# Patient Record
Sex: Female | Born: 1955
Health system: Southern US, Community
[De-identification: ages and names within clinical notes are randomized; demographics above are authoritative.]

## PROBLEM LIST (undated history)

## (undated) DIAGNOSIS — F17200 Nicotine dependence, unspecified, uncomplicated: Secondary | ICD-10-CM

## (undated) DIAGNOSIS — L570 Actinic keratosis: Secondary | ICD-10-CM

## (undated) HISTORY — DX: Actinic keratosis: L57.0

## (undated) HISTORY — DX: Nicotine dependence, unspecified, uncomplicated: F17.200

## (undated) HISTORY — PX: TUBAL LIGATION: SHX77

---

## 2002-01-31 ENCOUNTER — Encounter: Payer: Self-pay | Admitting: Obstetrics and Gynecology

## 2002-01-31 ENCOUNTER — Ambulatory Visit (HOSPITAL_COMMUNITY): Admission: RE | Admit: 2002-01-31 | Discharge: 2002-01-31 | Payer: Self-pay | Admitting: Obstetrics and Gynecology

## 2003-02-13 ENCOUNTER — Ambulatory Visit (HOSPITAL_COMMUNITY): Admission: RE | Admit: 2003-02-13 | Discharge: 2003-02-13 | Payer: Self-pay | Admitting: Obstetrics and Gynecology

## 2003-02-13 ENCOUNTER — Encounter: Payer: Self-pay | Admitting: Obstetrics and Gynecology

## 2004-02-15 ENCOUNTER — Ambulatory Visit (HOSPITAL_COMMUNITY): Admission: RE | Admit: 2004-02-15 | Discharge: 2004-02-15 | Payer: Self-pay | Admitting: Obstetrics and Gynecology

## 2005-02-18 ENCOUNTER — Ambulatory Visit (HOSPITAL_COMMUNITY): Admission: RE | Admit: 2005-02-18 | Discharge: 2005-02-18 | Payer: Self-pay | Admitting: Obstetrics and Gynecology

## 2006-03-12 ENCOUNTER — Ambulatory Visit (HOSPITAL_COMMUNITY): Admission: RE | Admit: 2006-03-12 | Discharge: 2006-03-12 | Payer: Self-pay | Admitting: Obstetrics and Gynecology

## 2007-03-16 ENCOUNTER — Ambulatory Visit (HOSPITAL_COMMUNITY): Admission: RE | Admit: 2007-03-16 | Discharge: 2007-03-16 | Payer: Self-pay | Admitting: Obstetrics and Gynecology

## 2008-03-20 ENCOUNTER — Ambulatory Visit (HOSPITAL_COMMUNITY): Admission: RE | Admit: 2008-03-20 | Discharge: 2008-03-20 | Payer: Self-pay | Admitting: Obstetrics and Gynecology

## 2008-09-17 ENCOUNTER — Other Ambulatory Visit: Admission: RE | Admit: 2008-09-17 | Discharge: 2008-09-17 | Payer: Self-pay | Admitting: Obstetrics & Gynecology

## 2009-03-25 ENCOUNTER — Ambulatory Visit (HOSPITAL_COMMUNITY): Admission: RE | Admit: 2009-03-25 | Discharge: 2009-03-25 | Payer: Self-pay | Admitting: Obstetrics & Gynecology

## 2009-11-21 ENCOUNTER — Other Ambulatory Visit: Admission: RE | Admit: 2009-11-21 | Discharge: 2009-11-21 | Payer: Self-pay | Admitting: Obstetrics and Gynecology

## 2010-04-25 ENCOUNTER — Ambulatory Visit (HOSPITAL_COMMUNITY)
Admission: RE | Admit: 2010-04-25 | Discharge: 2010-04-25 | Payer: Self-pay | Source: Home / Self Care | Attending: Obstetrics & Gynecology | Admitting: Obstetrics & Gynecology

## 2010-11-26 ENCOUNTER — Other Ambulatory Visit (HOSPITAL_COMMUNITY)
Admission: RE | Admit: 2010-11-26 | Discharge: 2010-11-26 | Disposition: A | Discharge status: Home / Self Care | Payer: 59 | Source: Ambulatory Visit | Attending: Obstetrics and Gynecology | Admitting: Obstetrics and Gynecology

## 2010-11-26 DIAGNOSIS — Z01419 Encounter for gynecological examination (general) (routine) without abnormal findings: Secondary | ICD-10-CM | POA: Insufficient documentation

## 2011-03-27 ENCOUNTER — Other Ambulatory Visit: Payer: Self-pay | Admitting: Obstetrics & Gynecology

## 2011-03-27 DIAGNOSIS — Z139 Encounter for screening, unspecified: Secondary | ICD-10-CM

## 2011-04-28 ENCOUNTER — Ambulatory Visit (HOSPITAL_COMMUNITY)
Admission: RE | Admit: 2011-04-28 | Discharge: 2011-04-28 | Disposition: A | Discharge status: Home / Self Care | Payer: 59 | Source: Ambulatory Visit | Attending: Obstetrics & Gynecology | Admitting: Obstetrics & Gynecology

## 2011-04-28 DIAGNOSIS — Z1231 Encounter for screening mammogram for malignant neoplasm of breast: Secondary | ICD-10-CM | POA: Insufficient documentation

## 2011-04-28 DIAGNOSIS — Z139 Encounter for screening, unspecified: Secondary | ICD-10-CM

## 2011-05-11 ENCOUNTER — Other Ambulatory Visit: Payer: Self-pay | Admitting: Obstetrics & Gynecology

## 2011-05-11 DIAGNOSIS — R928 Other abnormal and inconclusive findings on diagnostic imaging of breast: Secondary | ICD-10-CM

## 2011-06-03 ENCOUNTER — Ambulatory Visit (HOSPITAL_COMMUNITY)
Admission: RE | Admit: 2011-06-03 | Discharge: 2011-06-03 | Disposition: A | Discharge status: Home / Self Care | Payer: 59 | Source: Ambulatory Visit | Attending: Obstetrics & Gynecology | Admitting: Obstetrics & Gynecology

## 2011-06-03 DIAGNOSIS — R928 Other abnormal and inconclusive findings on diagnostic imaging of breast: Secondary | ICD-10-CM

## 2011-11-30 ENCOUNTER — Other Ambulatory Visit (HOSPITAL_COMMUNITY)
Admission: RE | Admit: 2011-11-30 | Discharge: 2011-11-30 | Disposition: A | Discharge status: Home / Self Care | Payer: 59 | Source: Ambulatory Visit | Attending: Obstetrics and Gynecology | Admitting: Obstetrics and Gynecology

## 2011-11-30 DIAGNOSIS — Z1151 Encounter for screening for human papillomavirus (HPV): Secondary | ICD-10-CM | POA: Insufficient documentation

## 2011-11-30 DIAGNOSIS — Z01419 Encounter for gynecological examination (general) (routine) without abnormal findings: Secondary | ICD-10-CM | POA: Insufficient documentation

## 2012-05-10 ENCOUNTER — Other Ambulatory Visit: Payer: Self-pay | Admitting: Adult Health

## 2012-05-10 DIAGNOSIS — Z139 Encounter for screening, unspecified: Secondary | ICD-10-CM

## 2012-06-06 ENCOUNTER — Ambulatory Visit (HOSPITAL_COMMUNITY)
Admission: RE | Admit: 2012-06-06 | Discharge: 2012-06-06 | Disposition: A | Discharge status: Home / Self Care | Payer: 59 | Source: Ambulatory Visit | Attending: Adult Health | Admitting: Adult Health

## 2012-06-06 DIAGNOSIS — Z139 Encounter for screening, unspecified: Secondary | ICD-10-CM

## 2012-06-06 DIAGNOSIS — Z1231 Encounter for screening mammogram for malignant neoplasm of breast: Secondary | ICD-10-CM | POA: Insufficient documentation

## 2012-12-02 ENCOUNTER — Ambulatory Visit (INDEPENDENT_AMBULATORY_CARE_PROVIDER_SITE_OTHER): Payer: 59 | Admitting: Adult Health

## 2012-12-02 ENCOUNTER — Encounter: Payer: Self-pay | Admitting: Adult Health

## 2012-12-02 VITALS — BP 158/80 | Ht 63.0 in | Wt 154.5 lb

## 2012-12-02 DIAGNOSIS — L570 Actinic keratosis: Secondary | ICD-10-CM

## 2012-12-02 DIAGNOSIS — Z01419 Encounter for gynecological examination (general) (routine) without abnormal findings: Secondary | ICD-10-CM

## 2012-12-02 HISTORY — DX: Actinic keratosis: L57.0

## 2012-12-02 NOTE — Progress Notes (Signed)
Patient ID: Nichole Gay, female   DOB: 1955-05-18, 57 y.o.   MRN: [id number] History of Present Illness: Nichole Gay is a 57 year old white female, married in for physical. She had a normal pap with negative HPV 7/13. And normal mammogram 1/14.No complaints.   Current Medications, Allergies, Past Medical History, Past Surgical History, Family History and Social History were reviewed in [employer] record.     Review of Systems: Patient denies any headaches, blurred vision, shortness of breath, chest pain, abdominal pain, problems with bowel movements, urination, or intercourse. No joint pains or mood changes.    Physical Exam:BP 158/80  Ht 5\' 3"  (1.6 m)  Wt 154 lb 8 oz (70.081 kg)  BMI 27.38 kg/m2 General:  Well developed, well nourished, no acute distress Skin:  Warm and dry, she has a AK on back that itches Neck:  Midline trachea, normal thyroid Lungs; Clear to auscultation bilaterally Breast:  No dominant palpable mass, retraction, or nipple discharge Cardiovascular: Regular rate and rhythm Abdomen:  Soft, non tender, no hepatosplenomegaly Pelvic:  External genitalia is normal in appearance.  The vagina is normal in appearance. The cervix is bulbous.  Uterus is felt to be normal size, shape, and contour.  No adnexal masses or tenderness noted. Rectal: Good sphincter tone, no polyps, or hemorrhoids felt.  Hemoccult negative. Extremities:  No swelling or varicosities noted Psych:  Alert and cooperative, seems happy   Impression: Yearly gyn exam-no pap AK    Plan: Physical in 1 year  Mammogram yearly Colonoscopy advised See Dr Reuel Boom for AK removal Order given for CBC,CMP,TSH,lipids

## 2012-12-02 NOTE — Patient Instructions (Addendum)
Physical in 1 year Mammogram yearly  labs in future Colonoscopy needed See Dr Reuel Boom for AK

## 2013-05-31 ENCOUNTER — Other Ambulatory Visit: Payer: Self-pay | Admitting: Adult Health

## 2013-05-31 DIAGNOSIS — Z139 Encounter for screening, unspecified: Secondary | ICD-10-CM

## 2013-06-19 ENCOUNTER — Ambulatory Visit (HOSPITAL_COMMUNITY)
Admission: RE | Admit: 2013-06-19 | Discharge: 2013-06-19 | Disposition: A | Discharge status: Home / Self Care | Payer: 59 | Source: Ambulatory Visit | Attending: Adult Health | Admitting: Adult Health

## 2013-06-19 DIAGNOSIS — Z139 Encounter for screening, unspecified: Secondary | ICD-10-CM

## 2013-06-19 DIAGNOSIS — Z1231 Encounter for screening mammogram for malignant neoplasm of breast: Secondary | ICD-10-CM | POA: Insufficient documentation

## 2013-06-23 ENCOUNTER — Other Ambulatory Visit: Payer: Self-pay | Admitting: Adult Health

## 2013-06-23 DIAGNOSIS — R928 Other abnormal and inconclusive findings on diagnostic imaging of breast: Secondary | ICD-10-CM

## 2013-07-26 ENCOUNTER — Other Ambulatory Visit: Payer: Self-pay | Admitting: Adult Health

## 2013-07-26 ENCOUNTER — Ambulatory Visit (HOSPITAL_COMMUNITY)
Admission: RE | Admit: 2013-07-26 | Discharge: 2013-07-26 | Disposition: A | Discharge status: Home / Self Care | Payer: 59 | Source: Ambulatory Visit | Attending: Adult Health | Admitting: Adult Health

## 2013-07-26 DIAGNOSIS — R928 Other abnormal and inconclusive findings on diagnostic imaging of breast: Secondary | ICD-10-CM

## 2013-07-26 DIAGNOSIS — N63 Unspecified lump in unspecified breast: Secondary | ICD-10-CM | POA: Insufficient documentation

## 2013-12-20 ENCOUNTER — Ambulatory Visit (INDEPENDENT_AMBULATORY_CARE_PROVIDER_SITE_OTHER): Payer: 59 | Admitting: Adult Health

## 2013-12-20 ENCOUNTER — Encounter: Payer: Self-pay | Admitting: Adult Health

## 2013-12-20 VITALS — BP 136/88 | HR 78 | Ht 63.0 in | Wt 155.0 lb

## 2013-12-20 DIAGNOSIS — Z1212 Encounter for screening for malignant neoplasm of rectum: Secondary | ICD-10-CM

## 2013-12-20 DIAGNOSIS — Z01419 Encounter for gynecological examination (general) (routine) without abnormal findings: Secondary | ICD-10-CM

## 2013-12-20 LAB — COMPREHENSIVE METABOLIC PANEL
ALBUMIN: 4.3 g/dL (ref 3.5–5.2)
ALK PHOS: 76 U/L (ref 39–117)
ALT: 15 U/L (ref 0–35)
AST: 16 U/L (ref 0–37)
BUN: 14 mg/dL (ref 6–23)
CHLORIDE: 104 meq/L (ref 96–112)
CO2: 26 mEq/L (ref 19–32)
Calcium: 9.8 mg/dL (ref 8.4–10.5)
Creat: 0.87 mg/dL (ref 0.50–1.10)
Glucose, Bld: 83 mg/dL (ref 70–99)
POTASSIUM: 4.5 meq/L (ref 3.5–5.3)
Sodium: 139 mEq/L (ref 135–145)
Total Bilirubin: 0.8 mg/dL (ref 0.2–1.2)
Total Protein: 6.6 g/dL (ref 6.0–8.3)

## 2013-12-20 LAB — HEMOCCULT GUIAC POC 1CARD (OFFICE): FECAL OCCULT BLD: NEGATIVE

## 2013-12-20 LAB — LIPID PANEL
CHOL/HDL RATIO: 3.7 ratio
Cholesterol: 237 mg/dL — ABNORMAL HIGH (ref 0–200)
HDL: 64 mg/dL (ref >39)
LDL Cholesterol: 150 mg/dL — ABNORMAL HIGH (ref 0–99)
TRIGLYCERIDES: 115 mg/dL (ref <150)
VLDL: 23 mg/dL (ref 0–40)

## 2013-12-20 LAB — CBC
HEMATOCRIT: 42.9 % (ref 36.0–46.0)
HEMOGLOBIN: 14.8 g/dL (ref 12.0–15.0)
MCH: 30.9 pg (ref 26.0–34.0)
MCHC: 34.5 g/dL (ref 30.0–36.0)
MCV: 89.6 fL (ref 78.0–100.0)
PLATELETS: 409 10*3/uL — AB (ref 150–400)
RBC: 4.79 MIL/uL (ref 3.87–5.11)
RDW: 14.5 % (ref 11.5–15.5)
WBC: 9.8 10*3/uL (ref 4.0–10.5)

## 2013-12-20 LAB — TSH: TSH: 0.969 u[IU]/mL (ref 0.350–4.500)

## 2013-12-20 NOTE — Patient Instructions (Signed)
Physical in 1 year Mammogram yearly  Colonoscopy advised  

## 2013-12-20 NOTE — Progress Notes (Signed)
Patient ID: ERNESTINE ROHMAN, female   DOB: 1955/07/21, 58 y.o.   MRN: [id number] History of Present Illness: Ameliah is a 58 year old white female,in for a physical, she had normal pap with negative HPV 11/30/11.No complaints.   Current Medications, Allergies, Past Medical History, Past Surgical History, Family History and Social History were reviewed in [employer] record.     Review of Systems: Patient denies any headaches, blurred vision, shortness of breath, chest pain, abdominal pain, problems with bowel movements, urination, or intercourse. No joint pain or mood swings, has some hot flashes but not bad.    Physical Exam:BP 136/88  Pulse 78  Ht 5\' 3"  (1.6 m)  Wt 155 lb (70.308 kg)  BMI 27.46 kg/m2 General:  Well developed, well nourished, no acute distress Skin:  Warm and dry,tan Neck:  Midline trachea, normal thyroid Lungs; Clear to auscultation bilaterally Breast:  No dominant palpable mass, retraction, or nipple discharge Cardiovascular: Regular rate and rhythm Abdomen:  Soft, non tender, no hepatosplenomegaly Pelvic:  External genitalia is normal in appearance.  The vagina has loss of color, moisture and rugae. The cervix is smooth.  Uterus is felt to be normal size, shape, and contour.  No  adnexal masses or tenderness noted. Rectal: Good sphincter tone, no polyps, or hemorrhoids felt.  Hemoccult negative. Extremities:  No swelling or varicosities noted Psych:  No mood changes,alert and cooperative,seems happy   Impression: Yearly gyn exam no pap    Plan: Check CBC,CMP,TSH and lipids Physical in 1 year Mammogram in September Colonoscopy advised

## 2013-12-21 ENCOUNTER — Telehealth: Payer: Self-pay | Admitting: Adult Health

## 2013-12-21 NOTE — Telephone Encounter (Signed)
Left message about labs, will mail copy to pt

## 2013-12-27 ENCOUNTER — Other Ambulatory Visit: Payer: Self-pay | Admitting: Adult Health

## 2013-12-27 DIAGNOSIS — R229 Localized swelling, mass and lump, unspecified: Principal | ICD-10-CM

## 2013-12-27 DIAGNOSIS — IMO0002 Reserved for concepts with insufficient information to code with codable children: Secondary | ICD-10-CM

## 2014-02-20 ENCOUNTER — Encounter (HOSPITAL_COMMUNITY): Payer: 59

## 2014-02-20 ENCOUNTER — Ambulatory Visit (HOSPITAL_COMMUNITY)
Admission: RE | Admit: 2014-02-20 | Discharge: 2014-02-20 | Disposition: A | Discharge status: Home / Self Care | Payer: 59 | Source: Ambulatory Visit | Attending: Adult Health | Admitting: Adult Health

## 2014-02-20 DIAGNOSIS — N63 Unspecified lump in breast: Secondary | ICD-10-CM | POA: Diagnosis not present

## 2014-02-20 DIAGNOSIS — R229 Localized swelling, mass and lump, unspecified: Secondary | ICD-10-CM

## 2014-02-20 DIAGNOSIS — IMO0002 Reserved for concepts with insufficient information to code with codable children: Secondary | ICD-10-CM

## 2014-03-12 ENCOUNTER — Encounter: Payer: Self-pay | Admitting: Adult Health

## 2014-10-03 ENCOUNTER — Other Ambulatory Visit: Payer: Self-pay | Admitting: Adult Health

## 2014-10-03 DIAGNOSIS — Z09 Encounter for follow-up examination after completed treatment for conditions other than malignant neoplasm: Secondary | ICD-10-CM

## 2014-10-03 DIAGNOSIS — N6320 Unspecified lump in the left breast, unspecified quadrant: Secondary | ICD-10-CM

## 2014-10-30 ENCOUNTER — Other Ambulatory Visit: Payer: Self-pay | Admitting: Adult Health

## 2014-10-30 ENCOUNTER — Ambulatory Visit (HOSPITAL_COMMUNITY)
Admission: RE | Admit: 2014-10-30 | Discharge: 2014-10-30 | Disposition: A | Discharge status: Home / Self Care | Payer: 59 | Source: Ambulatory Visit | Attending: Adult Health | Admitting: Adult Health

## 2014-10-30 DIAGNOSIS — Z09 Encounter for follow-up examination after completed treatment for conditions other than malignant neoplasm: Secondary | ICD-10-CM

## 2014-10-30 DIAGNOSIS — N63 Unspecified lump in breast: Secondary | ICD-10-CM | POA: Insufficient documentation

## 2014-10-30 DIAGNOSIS — N6320 Unspecified lump in the left breast, unspecified quadrant: Secondary | ICD-10-CM

## 2014-12-24 ENCOUNTER — Ambulatory Visit (INDEPENDENT_AMBULATORY_CARE_PROVIDER_SITE_OTHER): Payer: Commercial Managed Care - HMO | Admitting: Adult Health

## 2014-12-24 ENCOUNTER — Other Ambulatory Visit (HOSPITAL_COMMUNITY)
Admission: RE | Admit: 2014-12-24 | Discharge: 2014-12-24 | Disposition: A | Discharge status: Home / Self Care | Payer: 59 | Source: Ambulatory Visit | Attending: Adult Health | Admitting: Adult Health

## 2014-12-24 ENCOUNTER — Encounter: Payer: Self-pay | Admitting: Adult Health

## 2014-12-24 VITALS — BP 130/70 | HR 84 | Ht 63.0 in | Wt 144.0 lb

## 2014-12-24 DIAGNOSIS — Z1151 Encounter for screening for human papillomavirus (HPV): Secondary | ICD-10-CM | POA: Insufficient documentation

## 2014-12-24 DIAGNOSIS — Z01419 Encounter for gynecological examination (general) (routine) without abnormal findings: Secondary | ICD-10-CM | POA: Diagnosis not present

## 2014-12-24 DIAGNOSIS — Z1212 Encounter for screening for malignant neoplasm of rectum: Secondary | ICD-10-CM

## 2014-12-24 LAB — HEMOCCULT GUIAC POC 1CARD (OFFICE): FECAL OCCULT BLD: NEGATIVE

## 2014-12-24 NOTE — Patient Instructions (Signed)
Physical in 1 year Mammogram yearly fasting labs in 1 year

## 2014-12-24 NOTE — Progress Notes (Signed)
Patient ID: KEELIE ZEMANEK, female   DOB: 1956/01/20, 59 y.o.   MRN: [id number] History of Present Illness:  Adelina is a 59 year old white female in for well woman gyn and pap.No complaints.  Current Medications, Allergies, Past Medical History, Past Surgical History, Family History and Social History were reviewed in [employer] record.     Review of Systems: Patient denies any headaches, hearing loss, fatigue, blurred vision, shortness of breath, chest pain, abdominal pain, problems with bowel movements, urination, or intercourse. No joint pain or mood swings.    Physical Exam:BP 130/70 mmHg  Pulse 84  Ht 5\' 3"  (1.6 m)  Wt 144 lb (65.318 kg)  BMI 25.51 kg/m2 General:  Well developed, well nourished, no acute distress Skin:  Warm and dry,tan Neck:  Midline trachea, normal thyroid, good ROM, no lymphadenopathy Lungs; Clear to auscultation bilaterally Breast:  No dominant palpable mass, retraction, or nipple discharge Cardiovascular: Regular rate and rhythm Abdomen:  Soft, non tender, no hepatosplenomegaly Pelvic:  External genitalia is normal in appearance, no lesions.  The vagina has decreased color, moisture and rugae.Marland Kitchen Urethra has no lesions or masses. The cervix is smooth, pap with HPV performed.  Uterus is felt to be normal size, shape, and contour.  No adnexal masses or tenderness noted.Bladder is non tender, no masses felt. Rectal: Good sphincter tone, no polyps, or hemorrhoids felt.  Hemoccult negative. Extremities/musculoskeletal:  No swelling or varicosities noted, no clubbing or cyanosis Psych:  No mood changes, alert and cooperative,seems happy   Impression: Well woman gyn exam and pap    Plan: Physical in 1 year,pap in 3 years if normal with negative HPV Fasting labs next year  Mammogram yearly, had in June 2016 Colonoscopy per GI

## 2014-12-25 LAB — CYTOLOGY - PAP

## 2015-09-23 ENCOUNTER — Other Ambulatory Visit: Payer: Self-pay | Admitting: Adult Health

## 2015-09-23 DIAGNOSIS — Z1231 Encounter for screening mammogram for malignant neoplasm of breast: Secondary | ICD-10-CM

## 2015-10-31 ENCOUNTER — Ambulatory Visit (HOSPITAL_COMMUNITY)
Admission: RE | Admit: 2015-10-31 | Discharge: 2015-10-31 | Disposition: A | Discharge status: Home / Self Care | Payer: 59 | Source: Ambulatory Visit | Attending: Adult Health | Admitting: Adult Health

## 2015-10-31 DIAGNOSIS — Z1231 Encounter for screening mammogram for malignant neoplasm of breast: Secondary | ICD-10-CM | POA: Insufficient documentation

## 2016-01-03 ENCOUNTER — Ambulatory Visit (INDEPENDENT_AMBULATORY_CARE_PROVIDER_SITE_OTHER): Payer: Commercial Managed Care - HMO | Admitting: Adult Health

## 2016-01-03 ENCOUNTER — Encounter: Payer: Self-pay | Admitting: Adult Health

## 2016-01-03 VITALS — BP 132/80 | HR 80 | Ht 62.5 in | Wt 144.0 lb

## 2016-01-03 DIAGNOSIS — Z1212 Encounter for screening for malignant neoplasm of rectum: Secondary | ICD-10-CM | POA: Diagnosis not present

## 2016-01-03 DIAGNOSIS — Z01419 Encounter for gynecological examination (general) (routine) without abnormal findings: Secondary | ICD-10-CM | POA: Diagnosis not present

## 2016-01-03 DIAGNOSIS — F17200 Nicotine dependence, unspecified, uncomplicated: Secondary | ICD-10-CM | POA: Insufficient documentation

## 2016-01-03 HISTORY — DX: Nicotine dependence, unspecified, uncomplicated: F17.200

## 2016-01-03 LAB — HEMOCCULT GUIAC POC 1CARD (OFFICE): Fecal Occult Blood, POC: NEGATIVE

## 2016-01-03 NOTE — Patient Instructions (Signed)
Physical in 1 year Pap in 2019 Mammogram yearly Colonoscopy per GI

## 2016-01-03 NOTE — Progress Notes (Signed)
Patient ID: Nichole Gay, female   DOB: 08/15/1955, 60 y.o.   MRN: [id number] History of Present Illness:  Nichole Gay is a 60 year old white female, married in for a well woman gyn exam, she had a normal pap with negative HPV 12/24/14.She is still working and is on 12 hours. PCP is Dr Nadara Mustard.  Current Medications, Allergies, Past Medical History, Past Surgical History, Family History and Social History were reviewed in [employer] record.     Review of Systems:  Patient denies any headaches, hearing loss, fatigue, blurred vision, shortness of breath, chest pain, abdominal pain, problems with bowel movements, urination, or intercourse. No joint pain or mood swings.She is having some congestion just got back from beach and it rained the whole time she says.   Physical Exam:BP 132/80 (BP Location: Left Arm, Patient Position: Sitting, Cuff Size: Normal)   Pulse 80   Ht 5' 2.5" (1.588 m)   Wt 144 lb (65.3 kg)   BMI 25.92 kg/m  General:  Well developed, well nourished, no acute distress Skin:  Warm and dry,tan Neck:  Midline trachea, normal thyroid, good ROM, no lymphadenopathy Lungs; Clear to auscultation bilaterally Breast:  No dominant palpable mass, retraction, or nipple discharge Cardiovascular: Regular rate and rhythm Abdomen:  Soft, non tender, no hepatosplenomegaly Pelvic:  External genitalia is normal in appearance, no lesions.  The vagina is normal in appearance. Urethra has no lesions or masses. The cervix is smooth. Uterus is felt to be normal size, shape, and contour.  No adnexal masses or tenderness noted.Bladder is non tender, no masses felt. Rectal: Good sphincter tone, no polyps, or hemorrhoids felt.  Hemoccult negative. Extremities/musculoskeletal:  No swelling or varicosities noted, no clubbing or cyanosis Psych:  No mood changes, alert and cooperative,seems happy Encouraged to decrease cigarettes.  Impression: Well woman gyn exam no pap Smoker      Plan: Check CBC,CMP,TSH and lipids,A1c and vitamin D, HIV and Hept. C antibody  Physical in 1 year, pap in 2019 Mammogram yearly Colonoscopy per GI Increase fluids to keep any secretions thin

## 2016-01-04 LAB — LIPID PANEL
CHOL/HDL RATIO: 4.4 ratio (ref 0.0–4.4)
Cholesterol, Total: 245 mg/dL — ABNORMAL HIGH (ref 100–199)
HDL: 56 mg/dL (ref >39)
LDL Calculated: 168 mg/dL — ABNORMAL HIGH (ref 0–99)
Triglycerides: 103 mg/dL (ref 0–149)
VLDL Cholesterol Cal: 21 mg/dL (ref 5–40)

## 2016-01-04 LAB — CBC
HEMATOCRIT: 44.5 % (ref 34.0–46.6)
Hemoglobin: 14.8 g/dL (ref 11.1–15.9)
MCH: 30.5 pg (ref 26.6–33.0)
MCHC: 33.3 g/dL (ref 31.5–35.7)
MCV: 92 fL (ref 79–97)
Platelets: 358 10*3/uL (ref 150–379)
RBC: 4.86 x10E6/uL (ref 3.77–5.28)
RDW: 14.3 % (ref 12.3–15.4)
WBC: 6.6 10*3/uL (ref 3.4–10.8)

## 2016-01-04 LAB — COMPREHENSIVE METABOLIC PANEL
A/G RATIO: 1.4 (ref 1.2–2.2)
ALBUMIN: 4.2 g/dL (ref 3.5–5.5)
ALK PHOS: 84 IU/L (ref 39–117)
ALT: 20 IU/L (ref 0–32)
AST: 23 IU/L (ref 0–40)
BILIRUBIN TOTAL: 0.6 mg/dL (ref 0.0–1.2)
BUN / CREAT RATIO: 20 (ref 9–23)
BUN: 16 mg/dL (ref 6–24)
CHLORIDE: 101 mmol/L (ref 96–106)
CO2: 22 mmol/L (ref 18–29)
Calcium: 10 mg/dL (ref 8.7–10.2)
Creatinine, Ser: 0.82 mg/dL (ref 0.57–1.00)
GFR calc non Af Amer: 79 mL/min/{1.73_m2} (ref >59)
GFR, EST AFRICAN AMERICAN: 91 mL/min/{1.73_m2} (ref >59)
GLOBULIN, TOTAL: 2.9 g/dL (ref 1.5–4.5)
Glucose: 86 mg/dL (ref 65–99)
Potassium: 4.8 mmol/L (ref 3.5–5.2)
SODIUM: 143 mmol/L (ref 134–144)
Total Protein: 7.1 g/dL (ref 6.0–8.5)

## 2016-01-04 LAB — VITAMIN D 25 HYDROXY (VIT D DEFICIENCY, FRACTURES): Vit D, 25-Hydroxy: 31.3 ng/mL (ref 30.0–100.0)

## 2016-01-04 LAB — TSH: TSH: 1.5 u[IU]/mL (ref 0.450–4.500)

## 2016-01-04 LAB — HEMOGLOBIN A1C
ESTIMATED AVERAGE GLUCOSE: 117 mg/dL
HEMOGLOBIN A1C: 5.7 % — AB (ref 4.8–5.6)

## 2016-01-04 LAB — HEPATITIS C ANTIBODY: Hep C Virus Ab: <0.1 s/co ratio (ref 0.0–0.9)

## 2016-01-04 LAB — HIV ANTIBODY (ROUTINE TESTING W REFLEX): HIV SCREEN 4TH GENERATION: NONREACTIVE

## 2016-01-06 ENCOUNTER — Telehealth: Payer: Self-pay | Admitting: Adult Health

## 2016-01-06 NOTE — Telephone Encounter (Signed)
Left message to call me back about labs  

## 2016-01-08 ENCOUNTER — Telehealth: Payer: Self-pay | Admitting: Adult Health

## 2016-01-08 NOTE — Telephone Encounter (Signed)
Left message x 1. JSY 

## 2016-01-08 NOTE — Telephone Encounter (Signed)
Pt was also advised to increase exercise and decrease red meat. Maudie Mercury, CNM reviewed results. Have JAG decide whether to start pt on med or just recheck in a few months. Pt voiced understanding. [place]

## 2016-01-08 NOTE — Telephone Encounter (Signed)
Spoke with pt advising to decrease carbs and lose weight.

## 2016-01-14 NOTE — Telephone Encounter (Signed)
No answer @ 12:16 pm. JSY

## 2016-01-15 NOTE — Telephone Encounter (Signed)
Left message @ 10:25 am. JSY

## 2016-01-16 NOTE — Telephone Encounter (Signed)
Left message x 2. JSY 

## 2016-01-17 NOTE — Telephone Encounter (Signed)
Spoke with pt letting her know JAG advised to decrease carbs and increase exercise. Will recheck labs in 1 year. Pt voiced understanding. [place]

## 2016-06-05 DIAGNOSIS — J209 Acute bronchitis, unspecified: Secondary | ICD-10-CM | POA: Diagnosis not present

## 2016-06-05 DIAGNOSIS — J019 Acute sinusitis, unspecified: Secondary | ICD-10-CM | POA: Diagnosis not present

## 2016-06-16 DIAGNOSIS — Z6824 Body mass index (BMI) 24.0-24.9, adult: Secondary | ICD-10-CM | POA: Diagnosis not present

## 2016-06-16 DIAGNOSIS — J0100 Acute maxillary sinusitis, unspecified: Secondary | ICD-10-CM | POA: Diagnosis not present

## 2016-08-02 DIAGNOSIS — J0100 Acute maxillary sinusitis, unspecified: Secondary | ICD-10-CM | POA: Diagnosis not present

## 2016-08-05 DIAGNOSIS — H109 Unspecified conjunctivitis: Secondary | ICD-10-CM | POA: Diagnosis not present

## 2016-08-29 DIAGNOSIS — J209 Acute bronchitis, unspecified: Secondary | ICD-10-CM | POA: Diagnosis not present

## 2016-08-29 DIAGNOSIS — J019 Acute sinusitis, unspecified: Secondary | ICD-10-CM | POA: Diagnosis not present

## 2017-01-06 ENCOUNTER — Telehealth: Payer: Self-pay | Admitting: Adult Health

## 2017-01-06 ENCOUNTER — Other Ambulatory Visit: Payer: Self-pay | Admitting: Adult Health

## 2017-01-06 DIAGNOSIS — Z1231 Encounter for screening mammogram for malignant neoplasm of breast: Secondary | ICD-10-CM

## 2017-01-06 NOTE — Telephone Encounter (Signed)
LMOVM that Nichole Gay would prefer to see her before labs are ordered.

## 2017-01-06 NOTE — Telephone Encounter (Signed)
Patient called stating that she has an appointment with Anderson Malta for her pap and physical on 9/12 @ 10:00 and she would like to have fasting labs done that morning before her appointment. Please place order

## 2017-01-15 ENCOUNTER — Ambulatory Visit (HOSPITAL_COMMUNITY)
Admission: RE | Admit: 2017-01-15 | Discharge: 2017-01-15 | Disposition: A | Discharge status: Home / Self Care | Payer: 59 | Source: Ambulatory Visit | Attending: Adult Health | Admitting: Adult Health

## 2017-01-15 DIAGNOSIS — J209 Acute bronchitis, unspecified: Secondary | ICD-10-CM | POA: Diagnosis not present

## 2017-01-15 DIAGNOSIS — Z1231 Encounter for screening mammogram for malignant neoplasm of breast: Secondary | ICD-10-CM | POA: Diagnosis present

## 2017-01-15 DIAGNOSIS — J0101 Acute recurrent maxillary sinusitis: Secondary | ICD-10-CM | POA: Diagnosis not present

## 2017-01-15 DIAGNOSIS — Z6824 Body mass index (BMI) 24.0-24.9, adult: Secondary | ICD-10-CM | POA: Diagnosis not present

## 2017-01-20 ENCOUNTER — Other Ambulatory Visit: Payer: Commercial Managed Care - HMO | Admitting: Adult Health

## 2017-01-26 ENCOUNTER — Encounter: Payer: Self-pay | Admitting: Adult Health

## 2017-01-26 ENCOUNTER — Ambulatory Visit (INDEPENDENT_AMBULATORY_CARE_PROVIDER_SITE_OTHER): Payer: 59 | Admitting: Adult Health

## 2017-01-26 VITALS — BP 110/70 | HR 84 | Ht 64.0 in | Wt 144.0 lb

## 2017-01-26 DIAGNOSIS — Z1212 Encounter for screening for malignant neoplasm of rectum: Secondary | ICD-10-CM

## 2017-01-26 DIAGNOSIS — Z1211 Encounter for screening for malignant neoplasm of colon: Secondary | ICD-10-CM

## 2017-01-26 DIAGNOSIS — Z01419 Encounter for gynecological examination (general) (routine) without abnormal findings: Secondary | ICD-10-CM

## 2017-01-26 LAB — HEMOCCULT GUIAC POC 1CARD (OFFICE): FECAL OCCULT BLD: NEGATIVE

## 2017-01-26 NOTE — Patient Instructions (Signed)
Pap and physical in 1 year Mammogram yearly Labs next year Colonoscopy advised

## 2017-01-26 NOTE — Progress Notes (Signed)
Patient ID: Nichole Gay, female   DOB: 1955/12/06, 61 y.o.   MRN: [id number] History of Present Illness: Nichole Gay is a 61 year old white female, in for well woman gyn exam, had normal pap with negative HPV 12/24/14. PCP is Dr Nadara Mustard, who just treated her for bronchitis and she is better.   Current Medications, Allergies, Past Medical History, Past Surgical History, Family History and Social History were reviewed in [employer] record.     Review of Systems: Patient denies any headaches, hearing loss, fatigue, blurred vision, shortness of breath, chest pain, abdominal pain, problems with bowel movements, urination, or intercourse. No joint pain or mood swings.    Physical Exam:BP 110/70 (BP Location: Left Arm, Patient Position: Sitting, Cuff Size: Small)   Pulse 84   Ht 5\' 4"  (1.626 m)   Wt 144 lb (65.3 kg)   BMI 24.72 kg/m  General:  Well developed, well nourished, no acute distress Skin:  Warm and dry Neck:  Midline trachea, normal thyroid, good ROM, no lymphadenopathy Lungs; Clear to auscultation bilaterally Breast:  No dominant palpable mass, retraction, or nipple discharge Cardiovascular: Regular rate and rhythm Abdomen:  Soft, non tender, no hepatosplenomegaly Pelvic:  External genitalia is normal in appearance, no lesions.  The vagina is normal in appearance. Urethra has no lesions or masses. The cervix is bulbous.  Uterus is felt to be normal size, shape, and contour.  No adnexal masses or tenderness noted.Bladder is non tender, no masses felt. Rectal: Good sphincter tone, no polyps, or hemorrhoids felt.  Hemoccult negative. Extremities/musculoskeletal:  No swelling or varicosities noted, no clubbing or cyanosis Psych:  No mood changes, alert and cooperative,seems happy PHQ 2 score 0.  Impression: 1. Well woman exam with routine gynecological exam   2. Screening for colorectal cancer       Plan: Pap and physical in 1 year Mammogram yearly Labs  next year Colonoscopy advised

## 2017-06-29 DIAGNOSIS — J209 Acute bronchitis, unspecified: Secondary | ICD-10-CM | POA: Diagnosis not present

## 2017-06-29 DIAGNOSIS — J019 Acute sinusitis, unspecified: Secondary | ICD-10-CM | POA: Diagnosis not present

## 2017-11-09 DIAGNOSIS — J0101 Acute recurrent maxillary sinusitis: Secondary | ICD-10-CM | POA: Diagnosis not present

## 2017-11-09 DIAGNOSIS — J209 Acute bronchitis, unspecified: Secondary | ICD-10-CM | POA: Diagnosis not present

## 2017-12-23 ENCOUNTER — Other Ambulatory Visit: Payer: Self-pay | Admitting: Adult Health

## 2017-12-23 DIAGNOSIS — Z1231 Encounter for screening mammogram for malignant neoplasm of breast: Secondary | ICD-10-CM

## 2018-01-20 ENCOUNTER — Ambulatory Visit (HOSPITAL_COMMUNITY)
Admission: RE | Admit: 2018-01-20 | Discharge: 2018-01-20 | Disposition: A | Discharge status: Home / Self Care | Payer: 59 | Source: Ambulatory Visit | Attending: Adult Health | Admitting: Adult Health

## 2018-01-20 DIAGNOSIS — Z1231 Encounter for screening mammogram for malignant neoplasm of breast: Secondary | ICD-10-CM | POA: Insufficient documentation

## 2018-01-25 ENCOUNTER — Other Ambulatory Visit: Payer: 59 | Admitting: Adult Health

## 2018-01-28 ENCOUNTER — Other Ambulatory Visit: Payer: 59

## 2018-01-28 ENCOUNTER — Encounter: Payer: Self-pay | Admitting: Adult Health

## 2018-01-28 ENCOUNTER — Other Ambulatory Visit (HOSPITAL_COMMUNITY)
Admission: RE | Admit: 2018-01-28 | Discharge: 2018-01-28 | Disposition: A | Discharge status: Home / Self Care | Payer: 59 | Source: Ambulatory Visit | Attending: Adult Health | Admitting: Adult Health

## 2018-01-28 ENCOUNTER — Ambulatory Visit (INDEPENDENT_AMBULATORY_CARE_PROVIDER_SITE_OTHER): Payer: 59 | Admitting: Adult Health

## 2018-01-28 VITALS — BP 145/90 | HR 71 | Ht 62.0 in | Wt 146.5 lb

## 2018-01-28 DIAGNOSIS — Z01419 Encounter for gynecological examination (general) (routine) without abnormal findings: Secondary | ICD-10-CM | POA: Diagnosis not present

## 2018-01-28 DIAGNOSIS — Z131 Encounter for screening for diabetes mellitus: Secondary | ICD-10-CM | POA: Diagnosis not present

## 2018-01-28 DIAGNOSIS — Z1211 Encounter for screening for malignant neoplasm of colon: Secondary | ICD-10-CM | POA: Diagnosis not present

## 2018-01-28 DIAGNOSIS — Z13220 Encounter for screening for lipoid disorders: Secondary | ICD-10-CM | POA: Diagnosis not present

## 2018-01-28 DIAGNOSIS — Z1212 Encounter for screening for malignant neoplasm of rectum: Secondary | ICD-10-CM

## 2018-01-28 DIAGNOSIS — Z1329 Encounter for screening for other suspected endocrine disorder: Secondary | ICD-10-CM

## 2018-01-28 LAB — HEMOCCULT GUIAC POC 1CARD (OFFICE): Fecal Occult Blood, POC: NEGATIVE

## 2018-01-28 NOTE — Progress Notes (Addendum)
Patient ID: Nichole Gay, female   DOB: 1955/11/24, 62 y.o.   MRN: [id number] History of Present Illness:  Nichole Gay is a 62 year old, white female, PM in for a well woman gyn exam and pap. PCP Is Dr Nadara Mustard.  Current Medications, Allergies, Past Medical History, Past Surgical History, Family History and Social History were reviewed in [employer] record.     Review of Systems: Patient denies any headaches, hearing loss, fatigue, blurred vision, shortness of breath, chest pain, abdominal pain, problems with bowel movements, urination, or intercourse. No joint pain or mood swings.    Physical Exam:BP (!) 145/90 (BP Location: Left Arm, Patient Position: Sitting, Cuff Size: Normal)   Pulse 71   Ht 5\' 2"  (1.575 m)   Wt 146 lb 8 oz (66.5 kg)   BMI 26.80 kg/m  General:  Well developed, well nourished, no acute distress Skin:  Warm and dry Neck:  Midline trachea, normal thyroid, good ROM, no lymphadenopathy,no carotid bruits heard Lungs; Clear to auscultation bilaterally Breast:  No dominant palpable mass, retraction, or nipple discharge Cardiovascular: Regular rate and rhythm Abdomen:  Soft, non tender, no hepatosplenomegaly Pelvic:  External genitalia is normal in appearance, no lesions.  The vagina is normal in appearance, for age with loss of color, moisture and rugae. Urethra has no lesions or masses. The cervix is smooth, pap with HPV performed.  Uterus is felt to be normal size, shape, and contour.  No adnexal masses or tenderness noted.Bladder is non tender, no masses felt. Rectal: Good sphincter tone, no polyps, or hemorrhoids felt.  Hemoccult negative. Extremities/musculoskeletal:  No swelling or varicosities noted, no clubbing or cyanosis Psych:  No mood changes, alert and cooperative,seems happy PHQ 2 score 0. Examination chaperoned by Levy Pupa LPN.  Impression: 1. Encounter for gynecological examination with Papanicolaou smear of cervix   2. Screening  for colorectal cancer       Plan: To get fasting labs today Physical in 1 year Pap in 3 if normal Mammogram yearly Consider cologuard, to check on her insurance

## 2018-01-29 LAB — COMPREHENSIVE METABOLIC PANEL
ALBUMIN: 4.4 g/dL (ref 3.6–4.8)
ALK PHOS: 80 IU/L (ref 39–117)
ALT: 20 IU/L (ref 0–32)
AST: 25 IU/L (ref 0–40)
Albumin/Globulin Ratio: 1.8 (ref 1.2–2.2)
BILIRUBIN TOTAL: 0.8 mg/dL (ref 0.0–1.2)
BUN/Creatinine Ratio: 15 (ref 12–28)
BUN: 13 mg/dL (ref 8–27)
CO2: 24 mmol/L (ref 20–29)
CREATININE: 0.87 mg/dL (ref 0.57–1.00)
Calcium: 9.9 mg/dL (ref 8.7–10.3)
Chloride: 104 mmol/L (ref 96–106)
GFR calc Af Amer: 84 mL/min/{1.73_m2} (ref >59)
GFR calc non Af Amer: 73 mL/min/{1.73_m2} (ref >59)
GLOBULIN, TOTAL: 2.5 g/dL (ref 1.5–4.5)
Glucose: 86 mg/dL (ref 65–99)
Potassium: 4.7 mmol/L (ref 3.5–5.2)
Sodium: 144 mmol/L (ref 134–144)
Total Protein: 6.9 g/dL (ref 6.0–8.5)

## 2018-01-29 LAB — CBC
HEMATOCRIT: 41.2 % (ref 34.0–46.6)
Hemoglobin: 14.3 g/dL (ref 11.1–15.9)
MCH: 31.1 pg (ref 26.6–33.0)
MCHC: 34.7 g/dL (ref 31.5–35.7)
MCV: 90 fL (ref 79–97)
Platelets: 412 10*3/uL (ref 150–450)
RBC: 4.6 x10E6/uL (ref 3.77–5.28)
RDW: 14.4 % (ref 12.3–15.4)
WBC: 8.7 10*3/uL (ref 3.4–10.8)

## 2018-01-29 LAB — VITAMIN D 25 HYDROXY (VIT D DEFICIENCY, FRACTURES): Vit D, 25-Hydroxy: 26.5 ng/mL — ABNORMAL LOW (ref 30.0–100.0)

## 2018-01-29 LAB — LIPID PANEL
Chol/HDL Ratio: 3.7 ratio (ref 0.0–4.4)
Cholesterol, Total: 223 mg/dL — ABNORMAL HIGH (ref 100–199)
HDL: 60 mg/dL (ref >39)
LDL Calculated: 146 mg/dL — ABNORMAL HIGH (ref 0–99)
Triglycerides: 87 mg/dL (ref 0–149)
VLDL Cholesterol Cal: 17 mg/dL (ref 5–40)

## 2018-01-29 LAB — HEMOGLOBIN A1C
ESTIMATED AVERAGE GLUCOSE: 117 mg/dL
HEMOGLOBIN A1C: 5.7 % — AB (ref 4.8–5.6)

## 2018-01-29 LAB — TSH: TSH: 1.25 u[IU]/mL (ref 0.450–4.500)

## 2018-01-31 ENCOUNTER — Telehealth: Payer: Self-pay | Admitting: Adult Health

## 2018-01-31 NOTE — Telephone Encounter (Signed)
Left message to call me tomorrow about labs

## 2018-02-01 ENCOUNTER — Telehealth: Payer: Self-pay | Admitting: Adult Health

## 2018-02-01 LAB — CYTOLOGY - PAP
Diagnosis: NEGATIVE
HPV: NOT DETECTED

## 2018-02-01 NOTE — Telephone Encounter (Signed)
Left message to call about labs 

## 2018-02-02 ENCOUNTER — Telehealth: Payer: Self-pay | Admitting: Adult Health

## 2018-02-02 DIAGNOSIS — Z6824 Body mass index (BMI) 24.0-24.9, adult: Secondary | ICD-10-CM | POA: Diagnosis not present

## 2018-02-02 DIAGNOSIS — J0101 Acute recurrent maxillary sinusitis: Secondary | ICD-10-CM | POA: Diagnosis not present

## 2018-02-02 DIAGNOSIS — J209 Acute bronchitis, unspecified: Secondary | ICD-10-CM | POA: Diagnosis not present

## 2018-02-02 NOTE — Telephone Encounter (Signed)
Pt aware of labs and pap, take 2000 IU Vitamin D every day

## 2018-04-04 DIAGNOSIS — Z6825 Body mass index (BMI) 25.0-25.9, adult: Secondary | ICD-10-CM | POA: Diagnosis not present

## 2018-04-04 DIAGNOSIS — J0101 Acute recurrent maxillary sinusitis: Secondary | ICD-10-CM | POA: Diagnosis not present

## 2018-05-13 DIAGNOSIS — J209 Acute bronchitis, unspecified: Secondary | ICD-10-CM | POA: Diagnosis not present

## 2018-05-13 DIAGNOSIS — R05 Cough: Secondary | ICD-10-CM | POA: Diagnosis not present

## 2018-07-23 DIAGNOSIS — Z6825 Body mass index (BMI) 25.0-25.9, adult: Secondary | ICD-10-CM | POA: Diagnosis not present

## 2018-07-23 DIAGNOSIS — J0101 Acute recurrent maxillary sinusitis: Secondary | ICD-10-CM | POA: Diagnosis not present

## 2019-12-23 IMAGING — MG DIGITAL SCREENING BILATERAL MAMMOGRAM WITH TOMO AND CAD
8 series · 8 of 24 positions shown · non-contrast
Comparison: Previous exam(s).

CLINICAL DATA: Screening.

EXAM:
DIGITAL SCREENING BILATERAL MAMMOGRAM WITH TOMO AND CAD

[L MLO synth-2D]
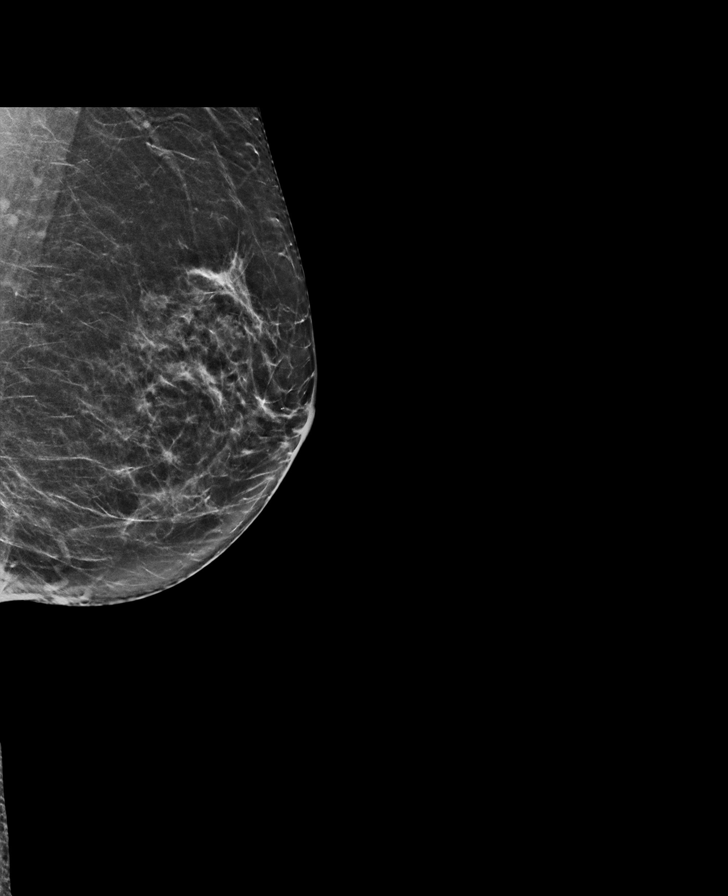

[R CC synth-2D]
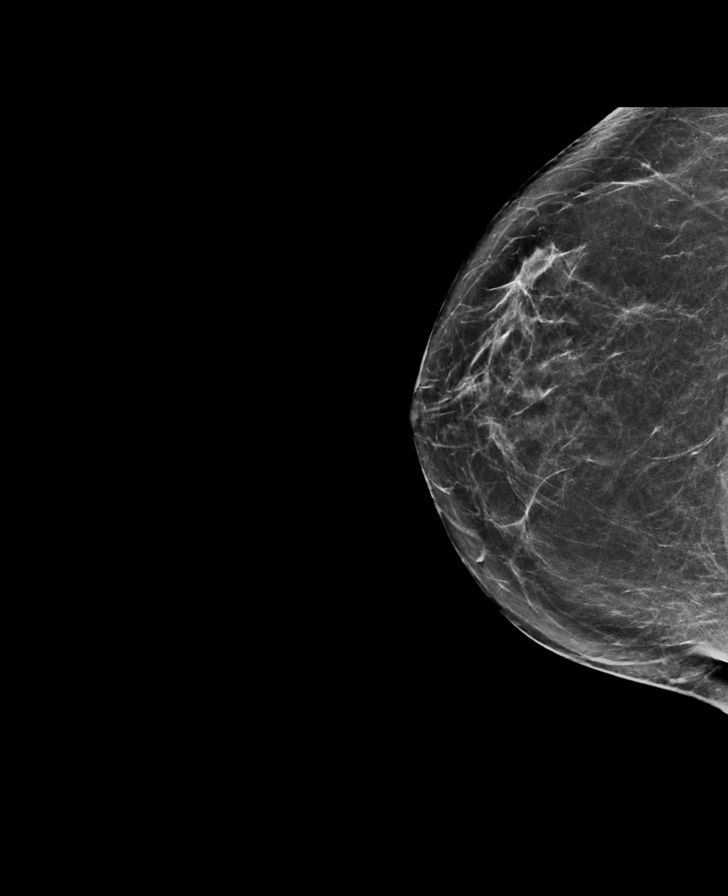

[R MLO synth-2D]
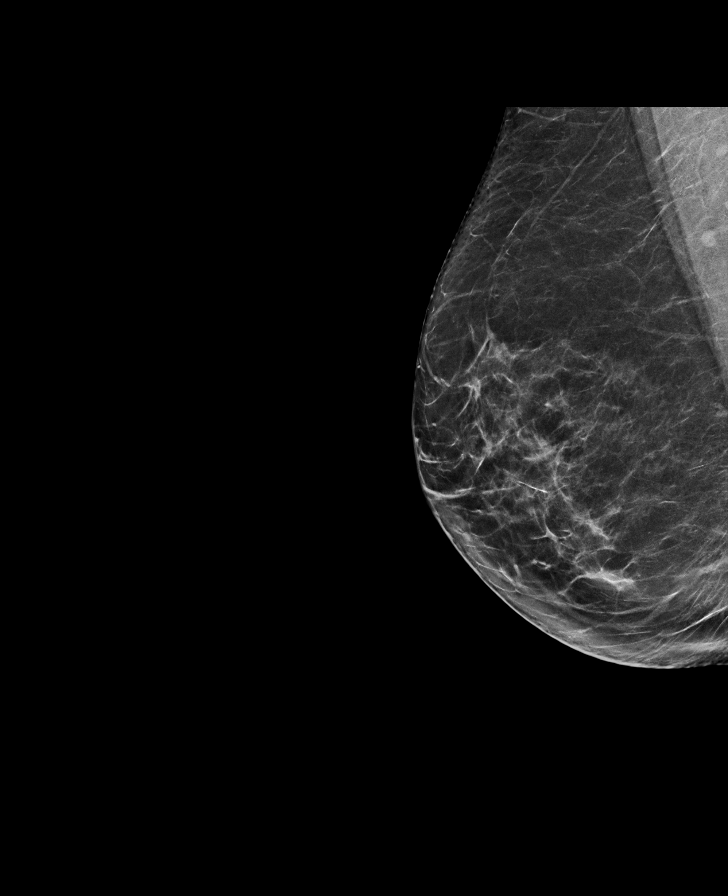

[L CC synth-2D]
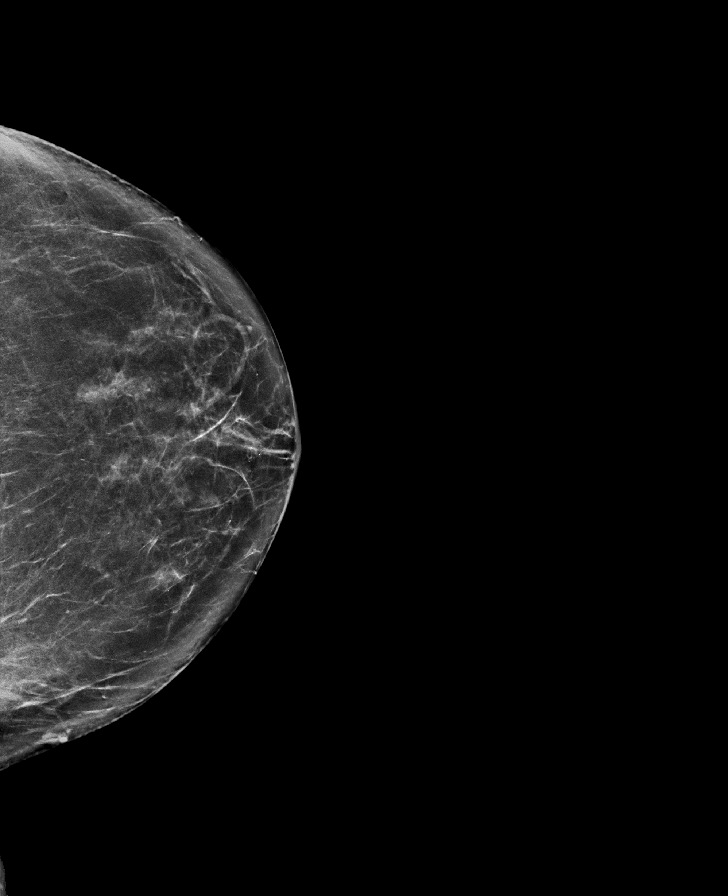

[R MLO tomo · tomo slice 37/72.0]
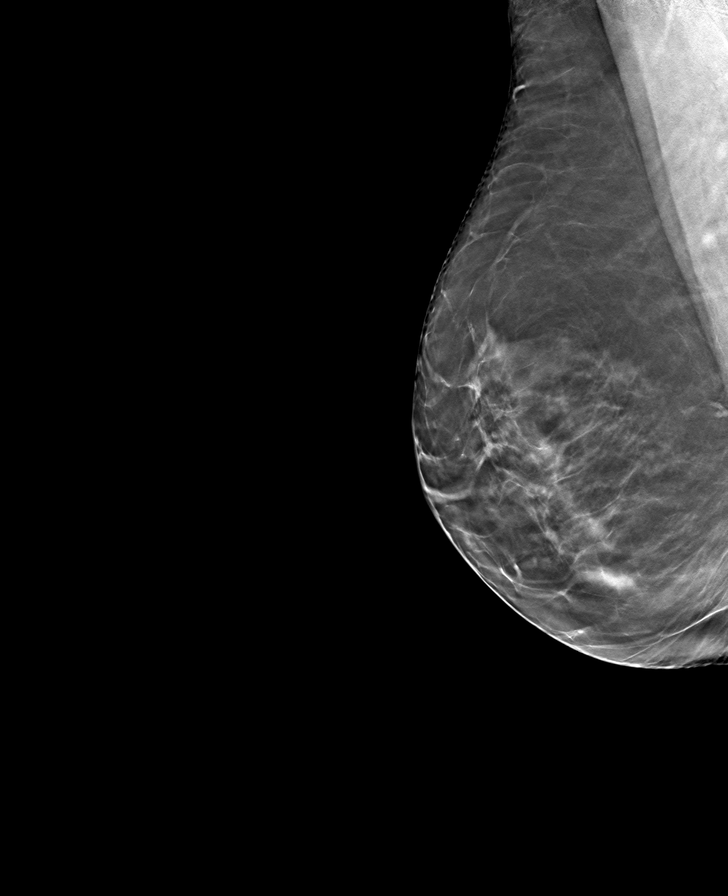

[L CC tomo · tomo slice 36/71.0]
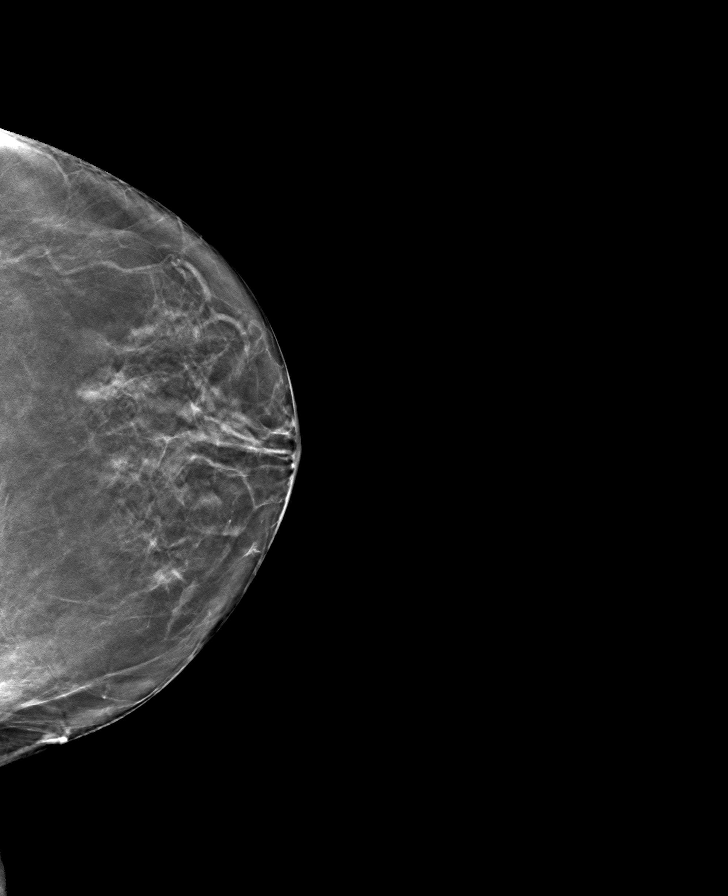

[R CC tomo · tomo slice 37/74.0]
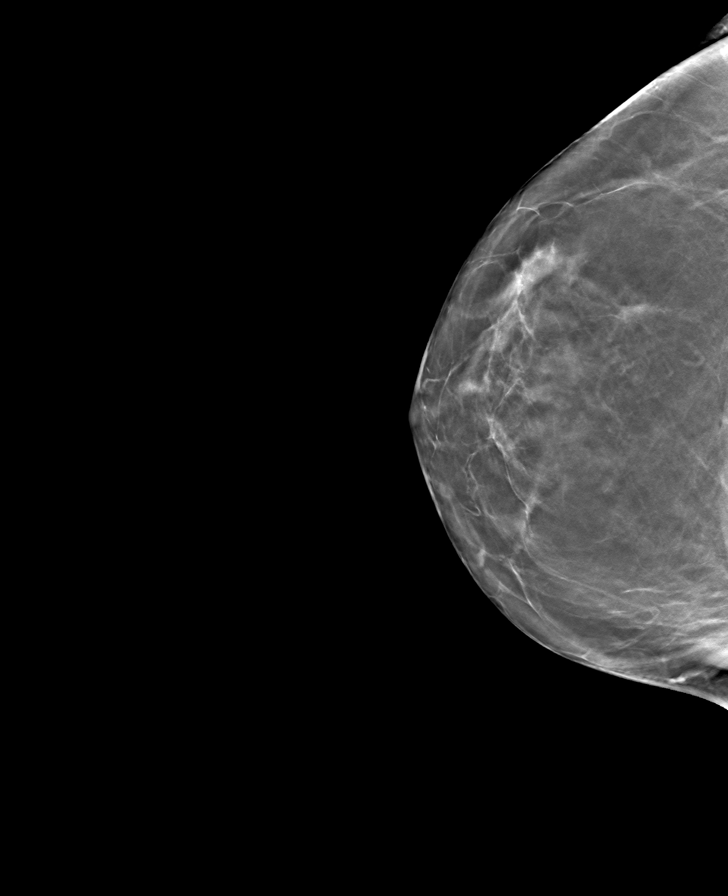

[L MLO tomo · tomo slice 34/67.0]
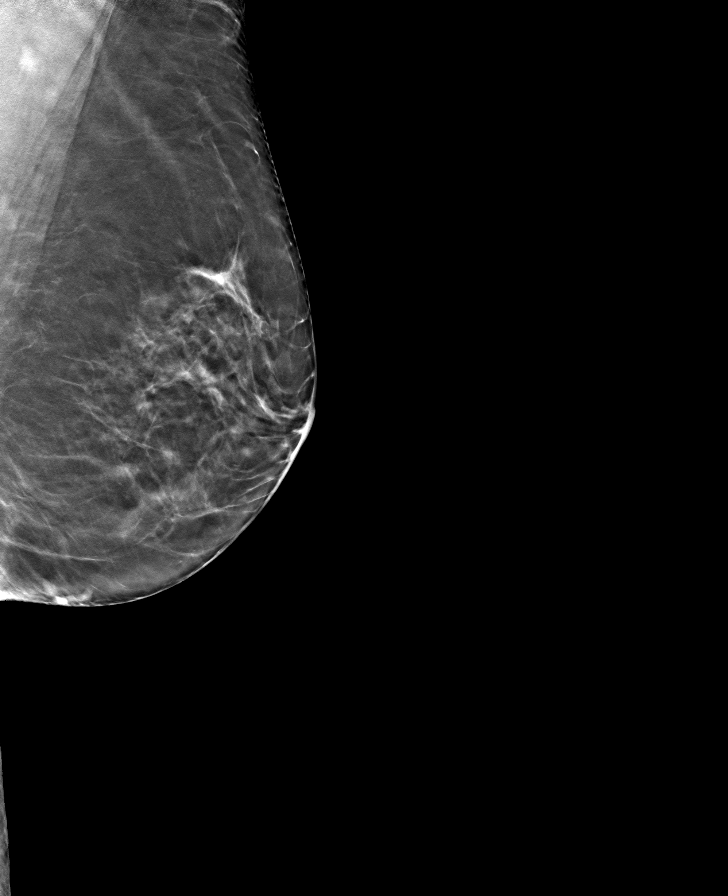

[8 of 24 positions shown; findings below may reference images not displayed]

ACR Breast Density Category b: There are scattered areas of
fibroglandular density.
FINDINGS: There are no findings suspicious for malignancy. Images were
processed with CAD.
IMPRESSION: No mammographic evidence of malignancy. A result letter of this
screening mammogram will be mailed directly to the patient.

RECOMMENDATION:
Screening mammogram in one year. (Code:[id number])

BI-RADS CATEGORY  1: Negative.

## 2019-12-29 ENCOUNTER — Other Ambulatory Visit (HOSPITAL_COMMUNITY): Payer: Self-pay | Admitting: Adult Health

## 2019-12-29 DIAGNOSIS — Z1231 Encounter for screening mammogram for malignant neoplasm of breast: Secondary | ICD-10-CM

## 2020-01-05 ENCOUNTER — Ambulatory Visit (HOSPITAL_COMMUNITY)
Admission: RE | Admit: 2020-01-05 | Discharge: 2020-01-05 | Disposition: A | Discharge status: Home / Self Care | Payer: 59 | Source: Ambulatory Visit | Attending: Adult Health | Admitting: Adult Health

## 2020-01-05 ENCOUNTER — Other Ambulatory Visit: Payer: Self-pay

## 2020-01-05 DIAGNOSIS — Z1231 Encounter for screening mammogram for malignant neoplasm of breast: Secondary | ICD-10-CM | POA: Diagnosis present

## 2020-01-26 ENCOUNTER — Encounter: Payer: Self-pay | Admitting: Adult Health

## 2020-01-26 ENCOUNTER — Ambulatory Visit (INDEPENDENT_AMBULATORY_CARE_PROVIDER_SITE_OTHER): Payer: 59 | Admitting: Adult Health

## 2020-01-26 VITALS — BP 136/94 | HR 74 | Ht 63.0 in | Wt 155.0 lb

## 2020-01-26 DIAGNOSIS — R7989 Other specified abnormal findings of blood chemistry: Secondary | ICD-10-CM

## 2020-01-26 DIAGNOSIS — R7309 Other abnormal glucose: Secondary | ICD-10-CM | POA: Diagnosis not present

## 2020-01-26 DIAGNOSIS — Z01419 Encounter for gynecological examination (general) (routine) without abnormal findings: Secondary | ICD-10-CM

## 2020-01-26 DIAGNOSIS — R030 Elevated blood-pressure reading, without diagnosis of hypertension: Secondary | ICD-10-CM

## 2020-01-26 DIAGNOSIS — Z1211 Encounter for screening for malignant neoplasm of colon: Secondary | ICD-10-CM

## 2020-01-26 LAB — HEMOCCULT GUIAC POC 1CARD (OFFICE): Fecal Occult Blood, POC: NEGATIVE

## 2020-01-26 NOTE — Progress Notes (Signed)
Patient ID: Nichole Gay, female   DOB: 01/25/1956, 64 y.o.   MRN: [id number] History of Present Illness:  Nichole Gay is a 64 year old white female, married, PM in for a well woman gyn exam, she had a normal pap with negative HPV 01/28/18. She has had both COVID vaccines. PCP is Dr Nadara Mustard.  Current Medications, Allergies, Past Medical History, Past Surgical History, Family History and Social History were reviewed in [employer] record.     Review of Systems:  Patient denies any headaches, hearing loss, fatigue, blurred vision, shortness of breath, chest pain, abdominal pain, problems with bowel movements, urination, or intercourse. No joint pain or mood swings. Took allergy meds this morning Pt not ready to quit smoking     Physical Exam:BP (!) 136/94 (BP Location: Left Arm, Cuff Size: Normal)   Pulse 74   Ht 5\' 3"  (1.6 m)   Wt 155 lb (70.3 kg)   BMI 27.46 kg/m  General:  Well developed, well nourished, no acute distress Skin:  Warm and dry,tan Neck:  Midline trachea, normal thyroid, good ROM, no lymphadenopathy,no carotid bruits heard  Lungs; Clear to auscultation bilaterally Breast:  No dominant palpable mass, retraction, or nipple discharge Cardiovascular: Regular rate and rhythm Abdomen:  Soft, non tender, no hepatosplenomegaly Pelvic:  External genitalia is normal in appearance, no lesions.  The vagina is normal in appearance for age, pale with loss of moisture and rugae. Urethra has no lesions or masses. The cervix is smooth.  Uterus is felt to be normal size, shape, and contour.  No adnexal masses or tenderness noted.Bladder is non tender, no masses felt. Rectal: Good sphincter tone, no polyps, or hemorrhoids felt.  Hemoccult negative. Extremities/musculoskeletal:  No swelling or varicosities noted, no clubbing or cyanosis Psych:  No mood changes, alert and cooperative,seems happy AA is 0 Fall risk is low PHQ 9 score is 1  [facility] - 01/26/20 1006        Pregnancy Intention Screening   Does the patient want to become pregnant in the next year? N/A   postmenopausal   Does the patient's partner want to become pregnant in the next year? N/A    Would the patient like to discuss contraceptive options today? N/A      Contraception Wrap Up   Current Method Female Sterilization    End Method Female Sterilization    Contraception Counseling Provided No         Examination chaperoned by Dwyane Dee LPN.  Impression and Plan:  1. Encounter for well woman exam with routine gynecological exam Pap and physical in 1 year  Mammogram yearly Check CBC,CMP,TSH and lipids Advised to get colonoscopy,last one about 20 years ago    2. Encounter for screening fecal occult blood testing   3. Elevated BP without diagnosis of hypertension Check BP at home and let me know if elevated  4. Elevated hemoglobin A1c Check A1c  5. Low vitamin D level Check vitamin D level

## 2020-01-27 LAB — COMPREHENSIVE METABOLIC PANEL
ALT: 17 IU/L (ref 0–32)
AST: 22 IU/L (ref 0–40)
Albumin/Globulin Ratio: 1.9 (ref 1.2–2.2)
Albumin: 4.7 g/dL (ref 3.8–4.8)
Alkaline Phosphatase: 87 IU/L (ref 44–121)
BUN/Creatinine Ratio: 16 (ref 12–28)
BUN: 15 mg/dL (ref 8–27)
Bilirubin Total: 0.7 mg/dL (ref 0.0–1.2)
CO2: 25 mmol/L (ref 20–29)
Calcium: 10.4 mg/dL — ABNORMAL HIGH (ref 8.7–10.3)
Chloride: 99 mmol/L (ref 96–106)
Creatinine, Ser: 0.96 mg/dL (ref 0.57–1.00)
GFR calc Af Amer: 73 mL/min/{1.73_m2} (ref >59)
GFR calc non Af Amer: 64 mL/min/{1.73_m2} (ref >59)
Globulin, Total: 2.5 g/dL (ref 1.5–4.5)
Glucose: 84 mg/dL (ref 65–99)
Potassium: 4.5 mmol/L (ref 3.5–5.2)
Sodium: 140 mmol/L (ref 134–144)
Total Protein: 7.2 g/dL (ref 6.0–8.5)

## 2020-01-27 LAB — TSH: TSH: 1.69 u[IU]/mL (ref 0.450–4.500)

## 2020-01-27 LAB — CBC
Hematocrit: 43.6 % (ref 34.0–46.6)
Hemoglobin: 14.6 g/dL (ref 11.1–15.9)
MCH: 30.7 pg (ref 26.6–33.0)
MCHC: 33.5 g/dL (ref 31.5–35.7)
MCV: 92 fL (ref 79–97)
Platelets: 388 10*3/uL (ref 150–450)
RBC: 4.76 x10E6/uL (ref 3.77–5.28)
RDW: 13 % (ref 11.7–15.4)
WBC: 8.3 10*3/uL (ref 3.4–10.8)

## 2020-01-27 LAB — LIPID PANEL
Chol/HDL Ratio: 4.3 ratio (ref 0.0–4.4)
Cholesterol, Total: 242 mg/dL — ABNORMAL HIGH (ref 100–199)
HDL: 56 mg/dL (ref >39)
LDL Chol Calc (NIH): 160 mg/dL — ABNORMAL HIGH (ref 0–99)
Triglycerides: 142 mg/dL (ref 0–149)
VLDL Cholesterol Cal: 26 mg/dL (ref 5–40)

## 2020-01-27 LAB — HEMOGLOBIN A1C
Est. average glucose Bld gHb Est-mCnc: 114 mg/dL
Hgb A1c MFr Bld: 5.6 % (ref 4.8–5.6)

## 2020-01-27 LAB — VITAMIN D 25 HYDROXY (VIT D DEFICIENCY, FRACTURES): Vit D, 25-Hydroxy: 28.8 ng/mL — ABNORMAL LOW (ref 30.0–100.0)

## 2020-01-30 ENCOUNTER — Telehealth: Payer: Self-pay | Admitting: Adult Health

## 2020-01-30 MED ORDER — CHOLECALCIFEROL 125 MCG (5000 UT) PO CAPS: 5000.0000 [IU] | ORAL_CAPSULE | Freq: Every day | ORAL | Status: AC

## 2020-01-30 NOTE — Telephone Encounter (Signed)
Pt aware of labs, take vitamin D 3 and do not take extra calcium and watch fats

## 2020-12-04 ENCOUNTER — Other Ambulatory Visit (HOSPITAL_COMMUNITY): Payer: Self-pay | Admitting: Adult Health

## 2020-12-04 DIAGNOSIS — Z1231 Encounter for screening mammogram for malignant neoplasm of breast: Secondary | ICD-10-CM

## 2021-01-10 ENCOUNTER — Other Ambulatory Visit: Payer: Self-pay

## 2021-01-10 ENCOUNTER — Ambulatory Visit (HOSPITAL_COMMUNITY)
Admission: RE | Admit: 2021-01-10 | Discharge: 2021-01-10 | Disposition: A | Discharge status: Home / Self Care | Payer: 59 | Source: Ambulatory Visit | Attending: Adult Health | Admitting: Adult Health

## 2021-01-10 DIAGNOSIS — Z1231 Encounter for screening mammogram for malignant neoplasm of breast: Secondary | ICD-10-CM | POA: Diagnosis not present

## 2021-02-07 ENCOUNTER — Encounter: Payer: Self-pay | Admitting: Adult Health

## 2021-02-07 ENCOUNTER — Other Ambulatory Visit (HOSPITAL_COMMUNITY)
Admission: RE | Admit: 2021-02-07 | Discharge: 2021-02-07 | Disposition: A | Discharge status: Home / Self Care | Payer: 59 | Source: Ambulatory Visit | Attending: Adult Health | Admitting: Adult Health

## 2021-02-07 ENCOUNTER — Other Ambulatory Visit: Payer: Self-pay

## 2021-02-07 ENCOUNTER — Ambulatory Visit (INDEPENDENT_AMBULATORY_CARE_PROVIDER_SITE_OTHER): Payer: 59 | Admitting: Adult Health

## 2021-02-07 VITALS — BP 148/92 | HR 96 | Ht 63.0 in | Wt 158.0 lb

## 2021-02-07 DIAGNOSIS — Z780 Asymptomatic menopausal state: Secondary | ICD-10-CM | POA: Diagnosis not present

## 2021-02-07 DIAGNOSIS — Z1211 Encounter for screening for malignant neoplasm of colon: Secondary | ICD-10-CM

## 2021-02-07 DIAGNOSIS — Z01419 Encounter for gynecological examination (general) (routine) without abnormal findings: Secondary | ICD-10-CM

## 2021-02-07 LAB — HEMOCCULT GUIAC POC 1CARD (OFFICE): Fecal Occult Blood, POC: NEGATIVE

## 2021-02-07 NOTE — Addendum Note (Signed)
Addended by: Octaviano Glow on: 02/07/2021 11:31 AM   Modules accepted: Orders

## 2021-02-07 NOTE — Progress Notes (Signed)
Patient ID: Nichole Gay, female   DOB: [birth year], 65 y.o.   MRN: [id number] History of Present Illness: Nichole Gay is a 65 year old white female,marriedm PM in for a well woman gyn exam and pap. PCP is [facility].   Current Medications, Allergies, Past Medical History, Past Surgical History, Family History and Social History were reviewed in [employer] record.     Review of Systems:  Patient denies any headaches, hearing loss, fatigue, blurred vision, shortness of breath, chest pain, abdominal pain, problems with bowel movements, urination, or intercourse. No joint pain or mood swings.  Denies any vaginal bleeding.   Physical Exam:BP (!) 148/92 (BP Location: Left Arm, Patient Position: Sitting, Cuff Size: Normal)   Pulse 96   Ht 5\' 3"  (1.6 m)   Wt 158 lb (71.7 kg)   BMI 27.99 kg/m   General:  Well developed, well nourished, no acute distress Skin:  Warm and dry Neck:  Midline trachea, normal thyroid, good ROM, no lymphadenopathy,no carotid bruits heard Lungs; Clear to auscultation bilaterally Breast:  No dominant palpable mass, retraction, or nipple discharge Cardiovascular: Regular rate and rhythm Abdomen:  Soft, non tender, no hepatosplenomegaly Pelvic:  External genitalia is normal in appearance, no lesions.  The vagina is pale, has fair moisture and has lost rugae. Urethra has no lesions or masses. The cervix is smooth, pap with HR HPV genotyping performed.  Uterus is felt to be normal size, shape, and contour.  No adnexal masses or tenderness noted.Bladder is non tender, no masses felt. Rectal: Good sphincter tone, no polyps, or hemorrhoids felt.  Hemoccult negative. Extremities/musculoskeletal:  No swelling or varicosities noted, no clubbing or cyanosis Psych:  No mood changes, alert and cooperative,seems happy AA is 0  Fall risk is low Depression screen [facility] 2/9 02/07/2021 01/26/2020 01/28/2018  Decreased Interest 0 0 0  Down, Depressed, Hopeless 0 0 0   PHQ - 2 Score 0 0 0  Altered sleeping 1 0 -  Tired, decreased energy 1 1 -  Change in appetite 0 0 -  Feeling bad or failure about yourself  0 0 -  Trouble concentrating 0 0 -  Moving slowly or fidgety/restless 0 0 -  Suicidal thoughts 0 0 -  PHQ-9 Score 2 1 -  Difficult doing work/chores - Not difficult at all -    GAD 7 : Generalized Anxiety Score 02/07/2021 01/26/2020  Nervous, Anxious, on Edge 0 1  Control/stop worrying 0 0  Worry too much - different things 1 1  Trouble relaxing 0 0  Restless 0 1  Easily annoyed or irritable 1 0  Afraid - awful might happen 0 0  Total GAD 7 Score 2 3  Anxiety Difficulty - Not difficult at all      [facility] - 02/07/21 0844       Pregnancy Intention Screening   Does the patient want to become pregnant in the next year? N/A    Does the patient's partner want to become pregnant in the next year? N/A    Would the patient like to discuss contraceptive options today? N/A      Contraception Wrap Up   Current Method No Method - Other Reason;Female Sterilization   postmenopause   End Method No Method - Other Reason;Female Sterilization    Contraception Counseling Provided No            Examination chaperoned by [employer].  Impression and Plan: 1. Encounter for gynecological examination with Papanicolaou smear of cervix  Pap sent Physical in 1 year Pap in 3 if normal Mammogram yearly Labs last year Call if wants fasting labs Declines colonoscopy and cologuard for now She says not ready to quit smoking   2. Encounter for screening fecal occult blood testing  3. Postmenopause

## 2021-02-11 LAB — CYTOLOGY - PAP
Comment: NEGATIVE
Diagnosis: NEGATIVE
High risk HPV: NEGATIVE

## 2021-12-18 ENCOUNTER — Other Ambulatory Visit (HOSPITAL_COMMUNITY): Payer: Self-pay | Admitting: Adult Health

## 2021-12-18 ENCOUNTER — Other Ambulatory Visit (HOSPITAL_COMMUNITY): Payer: Self-pay | Admitting: Family Medicine

## 2021-12-18 ENCOUNTER — Other Ambulatory Visit: Payer: Self-pay | Admitting: Adult Health

## 2021-12-18 DIAGNOSIS — Z1231 Encounter for screening mammogram for malignant neoplasm of breast: Secondary | ICD-10-CM

## 2022-01-15 ENCOUNTER — Ambulatory Visit (HOSPITAL_COMMUNITY)
Admission: RE | Admit: 2022-01-15 | Discharge: 2022-01-15 | Disposition: A | Discharge status: Home / Self Care | Payer: 59 | Source: Ambulatory Visit | Attending: Adult Health | Admitting: Adult Health

## 2022-01-15 DIAGNOSIS — Z1231 Encounter for screening mammogram for malignant neoplasm of breast: Secondary | ICD-10-CM | POA: Diagnosis not present

## 2022-02-10 ENCOUNTER — Encounter: Payer: Self-pay | Admitting: Adult Health

## 2022-02-10 ENCOUNTER — Ambulatory Visit (INDEPENDENT_AMBULATORY_CARE_PROVIDER_SITE_OTHER): Payer: 59 | Admitting: Adult Health

## 2022-02-10 VITALS — BP 130/90 | HR 77 | Ht 62.0 in | Wt 159.0 lb

## 2022-02-10 DIAGNOSIS — Z1211 Encounter for screening for malignant neoplasm of colon: Secondary | ICD-10-CM | POA: Diagnosis not present

## 2022-02-10 DIAGNOSIS — Z13220 Encounter for screening for lipoid disorders: Secondary | ICD-10-CM

## 2022-02-10 DIAGNOSIS — Z780 Asymptomatic menopausal state: Secondary | ICD-10-CM | POA: Diagnosis not present

## 2022-02-10 DIAGNOSIS — Z131 Encounter for screening for diabetes mellitus: Secondary | ICD-10-CM

## 2022-02-10 DIAGNOSIS — Z1212 Encounter for screening for malignant neoplasm of rectum: Secondary | ICD-10-CM

## 2022-02-10 DIAGNOSIS — F17200 Nicotine dependence, unspecified, uncomplicated: Secondary | ICD-10-CM | POA: Diagnosis not present

## 2022-02-10 DIAGNOSIS — Z01419 Encounter for gynecological examination (general) (routine) without abnormal findings: Secondary | ICD-10-CM

## 2022-02-10 LAB — HEMOCCULT GUIAC POC 1CARD (OFFICE): Fecal Occult Blood, POC: NEGATIVE

## 2022-02-10 NOTE — Progress Notes (Signed)
Patient ID: Nichole Gay, female   DOB: 1956-01-18, 66 y.o.   MRN: [id number] History of Present Illness: Nichole Gay is a 66 year old white female,married, PM in for a well woman gyn exam. She is fasting and requests labs.  Last pap was negative HPV and malignancy 02/07/21.  PCP is [facility].  Current Medications, Allergies, Past Medical History, Past Surgical History, Family History and Social History were reviewed in [employer] record.     Review of Systems: Patient denies any headaches,change in balance, hearing loss, fatigue, blurred vision, shortness of breath, chest pain, abdominal pain, problems with bowel movements, urination, or intercourse. No joint pain or mood swings.  Denies any vaginal bleeding  Physical Exam:BP (!) 130/90 (BP Location: Left Arm, Cuff Size: Large)   Pulse 77   Ht '5\' 2"'$  (1.575 m)   Wt 159 lb (72.1 kg)   BMI 29.08 kg/m   General:  Well developed, well nourished, no acute distress Skin:  Warm and dry Neck:  Midline trachea, normal thyroid, good ROM, no lymphadenopathy,no carotid bruits heard Lungs; Clear to auscultation bilaterally Breast:  No dominant palpable mass, retraction, or nipple discharge Cardiovascular: Regular rate and rhythm Abdomen:  Soft, non tender, no hepatosplenomegaly Pelvic:  External genitalia is normal in appearance, no lesions.  The vagina is pale with loss of moisture and rugae. Urethra has no lesions or masses. The cervix is smooth.  Uterus is felt to be normal size, shape, and contour.  No adnexal masses or tenderness noted.Bladder is non tender, no masses felt. Rectal: Good sphincter tone, no polyps, or hemorrhoids felt.  Hemoccult negative. Extremities/musculoskeletal:  No swelling or varicosities noted, no clubbing or cyanosis Psych:  No mood changes, alert and cooperative,seems happy AA is 0 Fall risk is low    02/10/2022    8:40 AM 02/07/2021    8:47 AM 01/26/2020   10:06 AM  Depression screen PHQ 2/9   Decreased Interest 0 0 0  Down, Depressed, Hopeless 0 0 0  PHQ - 2 Score 0 0 0  Altered sleeping 0 1 0  Tired, decreased energy 0 1 1  Change in appetite 0 0 0  Feeling bad or failure about yourself  0 0 0  Trouble concentrating 0 0 0  Moving slowly or fidgety/restless 0 0 0  Suicidal thoughts 0 0 0  PHQ-9 Score 0 2 1  Difficult doing work/chores   Not difficult at all       02/10/2022    8:40 AM 02/07/2021    8:48 AM 01/26/2020   10:06 AM  GAD 7 : Generalized Anxiety Score  Nervous, Anxious, on Edge 0 0 1  Control/stop worrying 0 0 0  Worry too much - different things 0 1 1  Trouble relaxing 0 0 0  Restless 0 0 1  Easily annoyed or irritable 0 1 0  Afraid - awful might happen 0 0 0  Total GAD 7 Score 0 2 3  Anxiety Difficulty   Not difficult at all    Upstream - 02/10/22 0840       Pregnancy Intention Screening   Does the patient want to become pregnant in the next year? N/A    Does the patient's partner want to become pregnant in the next year? N/A    Would the patient like to discuss contraceptive options today? N/A      Contraception Wrap Up   Current Method Female Sterilization    End Method Female Sterilization  Contraception Counseling Provided No            Examination chaperoned by Levy Pupa LPN     Impression and Plan:  1. Encounter for well woman exam with routine gynecological exam Physical in 1 year  Pap in 2025 Had negative mammogram 01/15/22 Keep check on BP at home - CBC - Comprehensive metabolic panel - TSH - Lipid panel  2. Encounter for screening fecal occult blood testing Hemoccult was negative  - POCT occult blood stool  3. Postmenopause Denies any vaginal bleeding  4. Smoker She says she does not smoke every days   5. Screening for colorectal cancer Will get cologuard - Cologuard  6. Screening for diabetes mellitus  - Hemoglobin A1c  7. Screening cholesterol level - Lipid panel

## 2022-02-11 LAB — COMPREHENSIVE METABOLIC PANEL
ALT: 16 IU/L (ref 0–32)
AST: 19 IU/L (ref 0–40)
Albumin/Globulin Ratio: 2 (ref 1.2–2.2)
Albumin: 4.5 g/dL (ref 3.9–4.9)
Alkaline Phosphatase: 94 IU/L (ref 44–121)
BUN/Creatinine Ratio: 16 (ref 12–28)
BUN: 16 mg/dL (ref 8–27)
Bilirubin Total: 0.7 mg/dL (ref 0.0–1.2)
CO2: 25 mmol/L (ref 20–29)
Calcium: 9.8 mg/dL (ref 8.7–10.3)
Chloride: 101 mmol/L (ref 96–106)
Creatinine, Ser: 0.98 mg/dL (ref 0.57–1.00)
Globulin, Total: 2.3 g/dL (ref 1.5–4.5)
Glucose: 90 mg/dL (ref 70–99)
Potassium: 4.6 mmol/L (ref 3.5–5.2)
Sodium: 140 mmol/L (ref 134–144)
Total Protein: 6.8 g/dL (ref 6.0–8.5)
eGFR: 64 mL/min/{1.73_m2} (ref >59)

## 2022-02-11 LAB — CBC
Hematocrit: 44 % (ref 34.0–46.6)
Hemoglobin: 14.5 g/dL (ref 11.1–15.9)
MCH: 30.1 pg (ref 26.6–33.0)
MCHC: 33 g/dL (ref 31.5–35.7)
MCV: 92 fL (ref 79–97)
Platelets: 386 10*3/uL (ref 150–450)
RBC: 4.81 x10E6/uL (ref 3.77–5.28)
RDW: 13.1 % (ref 11.7–15.4)
WBC: 8.2 10*3/uL (ref 3.4–10.8)

## 2022-02-11 LAB — LIPID PANEL
Chol/HDL Ratio: 4.3 ratio (ref 0.0–4.4)
Cholesterol, Total: 247 mg/dL — ABNORMAL HIGH (ref 100–199)
HDL: 57 mg/dL (ref >39)
LDL Chol Calc (NIH): 162 mg/dL — ABNORMAL HIGH (ref 0–99)
Triglycerides: 153 mg/dL — ABNORMAL HIGH (ref 0–149)
VLDL Cholesterol Cal: 28 mg/dL (ref 5–40)

## 2022-02-11 LAB — HEMOGLOBIN A1C
Est. average glucose Bld gHb Est-mCnc: 117 mg/dL
Hgb A1c MFr Bld: 5.7 % — ABNORMAL HIGH (ref 4.8–5.6)

## 2022-02-11 LAB — TSH: TSH: 1.74 u[IU]/mL (ref 0.450–4.500)

## 2022-02-24 LAB — COLOGUARD: COLOGUARD: POSITIVE — AB

## 2022-02-25 ENCOUNTER — Encounter: Payer: Self-pay | Admitting: Adult Health

## 2022-02-25 ENCOUNTER — Encounter: Payer: Self-pay | Admitting: *Deleted

## 2022-02-25 ENCOUNTER — Telehealth: Payer: Self-pay | Admitting: Adult Health

## 2022-02-25 DIAGNOSIS — R195 Other fecal abnormalities: Secondary | ICD-10-CM

## 2022-02-25 HISTORY — DX: Other fecal abnormalities: R19.5

## 2022-02-25 NOTE — Telephone Encounter (Signed)
Pt aware of + cologuard, will refer to [facility] for colonoscopy

## 2022-03-22 NOTE — H&P (View-Only) (Signed)
GI Office Note    Referring Provider: Estill Dooms, NP Primary Care Physician:   Lanelle Bal, PA-C Primary Gastroenterologist: Elon Alas. Abbey Chatters, DO   Chief Complaint   Chief Complaint  Patient presents with   Colonoscopy    Positive cologuard     History of Present Illness   Nichole Gay is a 66 y.o. female presenting today for further evaluation of positive cologuard request of Derrek Monaco, NP.Marland Kitchen   States she had a colonoscopy back in her 47s for bleeding at that time.  No family history of colon cancer.  Bowel movements have been regular.  No blood in the stool or melena. No abdominal pain.  No upper GI symptoms.  Patient states she typically has elevated blood pressure when in the doctor's office.  Usually with recheck blood pressures they improved.  On February 10, 2022 at her GYN, her blood pressure was initially 159/98 and with repeat was down to 130/90.  Medications   Current Outpatient Medications  Medication Sig Dispense Refill   albuterol (VENTOLIN HFA) 108 (90 Base) MCG/ACT inhaler      Cholecalciferol 125 MCG (5000 UT) capsule Take 1 capsule (5,000 Units total) by mouth daily.     Fexofenadine HCl (ALLEGRA PO) Take by mouth.     No current facility-administered medications for this visit.    Allergies   Allergies as of 03/23/2022 - Review Complete 03/23/2022  Allergen Reaction Noted   Bactrim [sulfamethoxazole-trimethoprim] Rash 12/02/2012    Past Medical History   Past Medical History:  Diagnosis Date   AK (actinic keratosis) 12/02/2012   On back   Positive colorectal cancer screening using Cologuard test 02/25/2022   02/25/22 refer to [facility] for colonoscopy, pt aware    Smoker 01/03/2016    Past Surgical History   Past Surgical History:  Procedure Laterality Date   TUBAL LIGATION      Past Family History   Family History  Problem Relation Age of Onset   Alzheimer's disease Mother    Cancer Father        liver   Colon  cancer Neg Hx     Past Social History   Social History   Socioeconomic History   Marital status: Married    Spouse name: Not on file   Number of children: Not on file   Years of education: Not on file   Highest education level: Not on file  Occupational History   Not on file  Tobacco Use   Smoking status: Every Day    Packs/day: 0.50    Years: 20.00    Total pack years: 10.00    Types: Cigarettes   Smokeless tobacco: Never  Vaping Use   Vaping Use: Never used  Substance and Sexual Activity   Alcohol use: No   Drug use: No   Sexual activity: Yes    Birth control/protection: Post-menopausal, Surgical    Comment: tubal  Other Topics Concern   Not on file  Social History Narrative   Not on file   Social Determinants of Health   Financial Resource Strain: Low Risk  (02/10/2022)   Overall Financial Resource Strain (CARDIA)    Difficulty of Paying Living Expenses: Not hard at all  Food Insecurity: No Food Insecurity (02/10/2022)   Hunger Vital Sign    Worried About Running Out of Food in the Last Year: Never true    Ran Out of Food in the Last Year: Never true  Transportation Needs: No  Transportation Needs (02/10/2022)   PRAPARE - Hydrologist (Medical): No    Lack of Transportation (Non-Medical): No  Physical Activity: Sufficiently Active (02/10/2022)   Exercise Vital Sign    Days of Exercise per Week: 3 days    Minutes of Exercise per Session: 60 min  Stress: No Stress Concern Present (02/10/2022)   [place]    Feeling of Stress : Only a little  Social Connections: Moderately Integrated (02/10/2022)   Social Connection and Isolation Panel [NHANES]    Frequency of Communication with Friends and Family: More than three times a week    Frequency of Social Gatherings with Friends and Family: More than three times a week    Attends Religious Services: More than 4 times per year     Active Member of [employer] or Organizations: No    Attends Archivist Meetings: Never    Marital Status: Married  Human resources officer Violence: Not At Risk (02/10/2022)   Humiliation, Afraid, Rape, and Kick questionnaire    Fear of Current or Ex-Partner: No    Emotionally Abused: No    Physically Abused: No    Sexually Abused: No    Review of Systems   General: Negative for anorexia, weight loss, fever, chills, fatigue, weakness. Eyes: Negative for vision changes.  ENT: Negative for hoarseness, difficulty swallowing , nasal congestion. CV: Negative for chest pain, angina, palpitations, dyspnea on exertion, peripheral edema.  Respiratory: Negative for dyspnea at rest, dyspnea on exertion, cough, sputum, wheezing.  GI: See history of present illness. GU:  Negative for dysuria, hematuria, urinary incontinence, urinary frequency, nocturnal urination.  MS: Negative for joint pain, low back pain.  Derm: Negative for rash or itching.  Neuro: Negative for weakness, abnormal sensation, seizure, frequent headaches, memory loss,  confusion.  Psych: Negative for anxiety, depression, suicidal ideation, hallucinations.  Endo: Negative for unusual weight change.  Heme: Negative for bruising or bleeding. Allergy: Negative for rash or hives.  Physical Exam   BP (!) 168/110 (BP Location: Right Arm, Patient Position: Sitting, Cuff Size: Normal)   Pulse 90   Temp 98.1 F (36.7 C) (Oral)   Ht '5\' 3"'$  (1.6 m)   Wt 162 lb 9.6 oz (73.8 kg)   SpO2 96%   BMI 28.80 kg/m    General: Well-nourished, well-developed in no acute distress.  Head: Normocephalic, atraumatic.   Eyes: Conjunctiva pink, no icterus. Mouth: Oropharyngeal mucosa moist and pink , no lesions erythema or exudate. Neck: Supple without thyromegaly, masses, or lymphadenopathy.  Lungs: Clear to auscultation bilaterally.  Heart: Regular rate and rhythm, no murmurs rubs or gallops.  Abdomen: Bowel sounds are normal, nontender,  nondistended, no hepatosplenomegaly or masses,  no abdominal bruits or hernia, no rebound or guarding.   Rectal: not performed Extremities: No lower extremity edema. No clubbing or deformities.  Neuro: Alert and oriented x 4 , grossly normal neurologically.  Skin: Warm and dry, no rash or jaundice.   Psych: Alert and cooperative, normal mood and affect.  Labs   Lab Results  Component Value Date   HGBA1C 5.7 (H) 02/10/2022   Lab Results  Component Value Date   TSH 1.740 02/10/2022   Lab Results  Component Value Date   ALT 16 02/10/2022   AST 19 02/10/2022   ALKPHOS 94 02/10/2022   BILITOT 0.7 02/10/2022   Lab Results  Component Value Date   WBC 8.2 02/10/2022   HGB 14.5  02/10/2022   HCT 44.0 02/10/2022   MCV 92 02/10/2022   PLT 386 02/10/2022    Imaging Studies   No results found.  Assessment   Positive Cologuard: Remote colonoscopy when she was in her 29s.  No family history of colon cancer.  No recent blood per rectum although she has had some fresh blood noted at times in the past.  No bowel concerns.  The patient was found to have elevated blood pressure when vital signs were checked in the office. The blood pressure was rechecked by the nursing staff and it was found be persistently elevated >140/90 mmHg. I personally advised to the patient to follow up closely with his PCP for hypertension control.    PLAN   Colonoscopy in the near future with Dr. Abbey Chatters.  ASA 2.  I have discussed the risks, alternatives, benefits with regards to but not limited to the risk of reaction to medication, bleeding, infection, perforation and the patient is agreeable to proceed. Written consent to be obtained.    Laureen Ochs. Bobby Rumpf, [place], [facility]

## 2022-03-22 NOTE — Progress Notes (Unsigned)
GI Office Note    Referring Provider: Estill Dooms, NP Primary Care Physician:  Rory Percy, MD  Primary Gastroenterologist:  Chief Complaint   No chief complaint on file.    History of Present Illness   Nichole Gay is a 66 y.o. female presenting today for further evaluation of positive cologuard.   Labs 02/10/2022:***         Medications   Current Outpatient Medications  Medication Sig Dispense Refill   albuterol (VENTOLIN HFA) 108 (90 Base) MCG/ACT inhaler      Cholecalciferol 125 MCG (5000 UT) capsule Take 1 capsule (5,000 Units total) by mouth daily.     Fexofenadine HCl (ALLEGRA PO) Take by mouth.     No current facility-administered medications for this visit.    Allergies   Allergies as of 03/23/2022 - Review Complete 02/10/2022  Allergen Reaction Noted   Bactrim [sulfamethoxazole-trimethoprim] Rash 12/02/2012    Past Medical History   Past Medical History:  Diagnosis Date   AK (actinic keratosis) 12/02/2012   On back   Positive colorectal cancer screening using Cologuard test 02/25/2022   02/25/22 refer to [facility] for colonoscopy, pt aware    Smoker 01/03/2016    Past Surgical History   Past Surgical History:  Procedure Laterality Date   TUBAL LIGATION      Past Family History   Family History  Problem Relation Age of Onset   Cancer Father        liver   Alzheimer's disease Mother     Past Social History   Social History   Socioeconomic History   Marital status: Married    Spouse name: Not on file   Number of children: Not on file   Years of education: Not on file   Highest education level: Not on file  Occupational History   Not on file  Tobacco Use   Smoking status: Some Days    Packs/day: 0.50    Years: 20.00    Total pack years: 10.00    Types: Cigarettes   Smokeless tobacco: Never  Vaping Use   Vaping Use: Never used  Substance and Sexual Activity   Alcohol use: No   Drug use: No   Sexual activity:  Yes    Birth control/protection: Post-menopausal, Surgical    Comment: tubal  Other Topics Concern   Not on file  Social History Narrative   Not on file   Social Determinants of Health   Financial Resource Strain: Low Risk  (02/10/2022)   Overall Financial Resource Strain (CARDIA)    Difficulty of Paying Living Expenses: Not hard at all  Food Insecurity: No Food Insecurity (02/10/2022)   Hunger Vital Sign    Worried About Running Out of Food in the Last Year: Never true    Ran Out of Food in the Last Year: Never true  Transportation Needs: No Transportation Needs (02/10/2022)   PRAPARE - Hydrologist (Medical): No    Lack of Transportation (Non-Medical): No  Physical Activity: Sufficiently Active (02/10/2022)   Exercise Vital Sign    Days of Exercise per Week: 3 days    Minutes of Exercise per Session: 60 min  Stress: No Stress Concern Present (02/10/2022)   [place]    Feeling of Stress : Only a little  Social Connections: Moderately Integrated (02/10/2022)   Social Connection and Isolation Panel [NHANES]    Frequency of Communication with  Friends and Family: More than three times a week    Frequency of Social Gatherings with Friends and Family: More than three times a week    Attends Religious Services: More than 4 times per year    Active Member of [employer] or Organizations: No    Attends Archivist Meetings: Never    Marital Status: Married  Human resources officer Violence: Not At Risk (02/10/2022)   Humiliation, Afraid, Rape, and Kick questionnaire    Fear of Current or Ex-Partner: No    Emotionally Abused: No    Physically Abused: No    Sexually Abused: No    Review of Systems   General: Negative for anorexia, weight loss, fever, chills, fatigue, weakness. Eyes: Negative for vision changes.  ENT: Negative for hoarseness, difficulty swallowing , nasal congestion. CV:  Negative for chest pain, angina, palpitations, dyspnea on exertion, peripheral edema.  Respiratory: Negative for dyspnea at rest, dyspnea on exertion, cough, sputum, wheezing.  GI: See history of present illness. GU:  Negative for dysuria, hematuria, urinary incontinence, urinary frequency, nocturnal urination.  MS: Negative for joint pain, low back pain.  Derm: Negative for rash or itching.  Neuro: Negative for weakness, abnormal sensation, seizure, frequent headaches, memory loss,  confusion.  Psych: Negative for anxiety, depression, suicidal ideation, hallucinations.  Endo: Negative for unusual weight change.  Heme: Negative for bruising or bleeding. Allergy: Negative for rash or hives.  Physical Exam   There were no vitals taken for this visit.   General: Well-nourished, well-developed in no acute distress.  Head: Normocephalic, atraumatic.   Eyes: Conjunctiva pink, no icterus. Mouth: Oropharyngeal mucosa moist and pink , no lesions erythema or exudate. Neck: Supple without thyromegaly, masses, or lymphadenopathy.  Lungs: Clear to auscultation bilaterally.  Heart: Regular rate and rhythm, no murmurs rubs or gallops.  Abdomen: Bowel sounds are normal, nontender, nondistended, no hepatosplenomegaly or masses,  no abdominal bruits or hernia, no rebound or guarding.   Rectal: *** Extremities: No lower extremity edema. No clubbing or deformities.  Neuro: Alert and oriented x 4 , grossly normal neurologically.  Skin: Warm and dry, no rash or jaundice.   Psych: Alert and cooperative, normal mood and affect.  Labs   *** Imaging Studies   No results found.  Assessment       PLAN   ***   Laureen Ochs. Bobby Rumpf, [place], [facility]

## 2022-03-23 ENCOUNTER — Encounter: Payer: Self-pay | Admitting: Gastroenterology

## 2022-03-23 ENCOUNTER — Telehealth: Payer: Self-pay | Admitting: *Deleted

## 2022-03-23 ENCOUNTER — Encounter: Payer: Self-pay | Admitting: *Deleted

## 2022-03-23 ENCOUNTER — Ambulatory Visit (INDEPENDENT_AMBULATORY_CARE_PROVIDER_SITE_OTHER): Payer: Medicare Other | Admitting: Gastroenterology

## 2022-03-23 VITALS — BP 153/98 | HR 82 | Temp 98.1°F | Ht 63.0 in | Wt 162.6 lb

## 2022-03-23 DIAGNOSIS — R195 Other fecal abnormalities: Secondary | ICD-10-CM

## 2022-03-23 MED ORDER — PEG 3350-KCL-NA BICARB-NACL 420 G PO SOLR: 4000.0000 mL | Freq: Once | ORAL | 0 refills | Status: AC

## 2022-03-23 NOTE — Telephone Encounter (Signed)
[employer] PA: APPROVED Authorization #: [id number]  DOS: 04/14/22-05/10/22

## 2022-03-23 NOTE — Patient Instructions (Addendum)
Colonoscopy to be scheduled. See separate instructions.  Your blood pressures were high today. I would recommend you follow up with your PCP regarding elevated blood pressure.

## 2022-04-14 ENCOUNTER — Other Ambulatory Visit: Payer: Self-pay

## 2022-04-14 ENCOUNTER — Ambulatory Visit (HOSPITAL_COMMUNITY)
Admission: RE | Admit: 2022-04-14 | Discharge: 2022-04-14 | Disposition: A | Discharge status: Home / Self Care | Payer: Medicare Other | Attending: Internal Medicine | Admitting: Internal Medicine

## 2022-04-14 ENCOUNTER — Ambulatory Visit (HOSPITAL_BASED_OUTPATIENT_CLINIC_OR_DEPARTMENT_OTHER): Payer: Medicare Other | Admitting: Certified Registered Nurse Anesthetist

## 2022-04-14 ENCOUNTER — Encounter (HOSPITAL_COMMUNITY): Payer: Self-pay

## 2022-04-14 ENCOUNTER — Ambulatory Visit (HOSPITAL_COMMUNITY): Payer: Medicare Other | Admitting: Certified Registered Nurse Anesthetist

## 2022-04-14 ENCOUNTER — Encounter (HOSPITAL_COMMUNITY)
Admission: RE | Disposition: A | Discharge status: Home / Self Care | Payer: Self-pay | Source: Home / Self Care | Attending: Internal Medicine

## 2022-04-14 DIAGNOSIS — D125 Benign neoplasm of sigmoid colon: Secondary | ICD-10-CM

## 2022-04-14 DIAGNOSIS — K5730 Diverticulosis of large intestine without perforation or abscess without bleeding: Secondary | ICD-10-CM | POA: Diagnosis not present

## 2022-04-14 DIAGNOSIS — K648 Other hemorrhoids: Secondary | ICD-10-CM | POA: Diagnosis not present

## 2022-04-14 DIAGNOSIS — F17210 Nicotine dependence, cigarettes, uncomplicated: Secondary | ICD-10-CM | POA: Diagnosis not present

## 2022-04-14 DIAGNOSIS — Z1211 Encounter for screening for malignant neoplasm of colon: Secondary | ICD-10-CM | POA: Diagnosis present

## 2022-04-14 DIAGNOSIS — I10 Essential (primary) hypertension: Secondary | ICD-10-CM | POA: Diagnosis not present

## 2022-04-14 DIAGNOSIS — R195 Other fecal abnormalities: Secondary | ICD-10-CM | POA: Diagnosis not present

## 2022-04-14 DIAGNOSIS — D123 Benign neoplasm of transverse colon: Secondary | ICD-10-CM | POA: Diagnosis not present

## 2022-04-14 HISTORY — PX: POLYPECTOMY: SHX5525

## 2022-04-14 HISTORY — PX: COLONOSCOPY WITH PROPOFOL: SHX5780

## 2022-04-14 SURGERY — COLONOSCOPY WITH PROPOFOL
Anesthesia: General

## 2022-04-14 MED ORDER — LACTATED RINGERS IV SOLN: INTRAVENOUS | Status: DC

## 2022-04-14 MED ORDER — LIDOCAINE HCL (CARDIAC) PF 100 MG/5ML IV SOSY
PREFILLED_SYRINGE | INTRAVENOUS | Status: DC | PRN
  Administered 2022-04-14: 50 mg via INTRAVENOUS

## 2022-04-14 MED ORDER — PROPOFOL 10 MG/ML IV BOLUS
INTRAVENOUS | Status: DC | PRN
  Administered 2022-04-14: 30 mg via INTRAVENOUS
  Administered 2022-04-14: 50 mg via INTRAVENOUS
  Administered 2022-04-14: 30 mg via INTRAVENOUS
  Administered 2022-04-14: 20 mg via INTRAVENOUS
  Administered 2022-04-14: 100 mg via INTRAVENOUS
  Administered 2022-04-14: 50 mg via INTRAVENOUS

## 2022-04-14 MED ORDER — STERILE WATER FOR IRRIGATION IR SOLN
Status: DC | PRN
  Administered 2022-04-14: 120 mL

## 2022-04-14 NOTE — Anesthesia Postprocedure Evaluation (Signed)
Anesthesia Post Note  Patient: Nichole Gay  Procedure(s) Performed: COLONOSCOPY WITH PROPOFOL POLYPECTOMY  Patient location during evaluation: Phase II Anesthesia Type: General Level of consciousness: awake and alert and oriented Pain management: pain level controlled Vital Signs Assessment: post-procedure vital signs reviewed and stable Respiratory status: spontaneous breathing, nonlabored ventilation and respiratory function stable Cardiovascular status: blood pressure returned to baseline and stable Postop Assessment: no apparent nausea or vomiting Anesthetic complications: no  No notable events documented.   Last Vitals:  Vitals:   04/14/22 0658 04/14/22 0802  BP: (!) 149/85 110/71  Pulse: 75 83  Resp: 19 16  Temp: 36.6 C 36.5 C  SpO2: 95% 99%    Last Pain:  Vitals:   04/14/22 0802  TempSrc: Axillary  PainSc: 0-No pain                 Dawsyn Zurn C Janes [place]grove

## 2022-04-14 NOTE — Anesthesia Preprocedure Evaluation (Addendum)
Anesthesia Evaluation  Patient identified by MRN, date of birth, ID band Patient awake    Reviewed: Allergy & Precautions, H&P , NPO status , Patient's Chart, lab work & pertinent test results  Airway Mallampati: II  TM Distance: >3 FB Neck ROM: Full    Dental no notable dental hx. (+) Dental Advisory Given, Teeth Intact   Pulmonary Current Smoker and Patient abstained from smoking.   Pulmonary exam normal breath sounds clear to auscultation       Cardiovascular negative cardio ROS Normal cardiovascular exam Rhythm:Regular Rate:Normal     Neuro/Psych negative neurological ROS  negative psych ROS   GI/Hepatic negative GI ROS, Neg liver ROS,,,  Endo/Other  negative endocrine ROS    Renal/GU negative Renal ROS  negative genitourinary   Musculoskeletal negative musculoskeletal ROS (+)    Abdominal   Peds negative pediatric ROS (+)  Hematology negative hematology ROS (+)   Anesthesia Other Findings   Reproductive/Obstetrics negative OB ROS                             Anesthesia Physical Anesthesia Plan  ASA: 2  Anesthesia Plan: General   Post-op Pain Management: Minimal or no pain anticipated   Induction: Intravenous  PONV Risk Score and Plan: Propofol infusion  Airway Management Planned: Natural Airway and Nasal Cannula  Additional Equipment:   Intra-op Plan:   Post-operative Plan:   Informed Consent: I have reviewed the patients History and Physical, chart, labs and discussed the procedure including the risks, benefits and alternatives for the proposed anesthesia with the patient or authorized representative who has indicated his/her understanding and acceptance.     Dental advisory given  Plan Discussed with: CRNA and Surgeon  Anesthesia Plan Comments:        Anesthesia Quick Evaluation

## 2022-04-14 NOTE — Discharge Instructions (Addendum)
  Colonoscopy Discharge Instructions  Read the instructions outlined below and refer to this sheet in the next few weeks. These discharge instructions provide you with general information on caring for yourself after you leave the hospital. Your doctor may also give you specific instructions. While your treatment has been planned according to the most current medical practices available, unavoidable complications occasionally occur.   ACTIVITY You may resume your regular activity, but move at a slower pace for the next 24 hours.  Take frequent rest periods for the next 24 hours.  Walking will help get rid of the air and reduce the bloated feeling in your belly (abdomen).  No driving for 24 hours (because of the medicine (anesthesia) used during the test).   Do not sign any important legal documents or operate any machinery for 24 hours (because of the anesthesia used during the test).  NUTRITION Drink plenty of fluids.  You may resume your normal diet as instructed by your doctor.  Begin with a light meal and progress to your normal diet. Heavy or fried foods are harder to digest and may make you feel sick to your stomach (nauseated).  Avoid alcoholic beverages for 24 hours or as instructed.  MEDICATIONS You may resume your normal medications unless your doctor tells you otherwise.  WHAT YOU CAN EXPECT TODAY Some feelings of bloating in the abdomen.  Passage of more gas than usual.  Spotting of blood in your stool or on the toilet paper.  IF YOU HAD POLYPS REMOVED DURING THE COLONOSCOPY: No aspirin products for 7 days or as instructed.  No alcohol for 7 days or as instructed.  Eat a soft diet for the next 24 hours.  FINDING OUT THE RESULTS OF YOUR TEST Not all test results are available during your visit. If your test results are not back during the visit, make an appointment with your caregiver to find out the results. Do not assume everything is normal if you have not heard from your  caregiver or the medical facility. It is important for you to follow up on all of your test results.  SEEK IMMEDIATE MEDICAL ATTENTION IF: You have more than a spotting of blood in your stool.  Your belly is swollen (abdominal distention).  You are nauseated or vomiting.  You have a temperature over 101.  You have abdominal pain or discomfort that is severe or gets worse throughout the day.   Your colonoscopy revealed 3 polyp(s) which I removed successfully. Await pathology results, my office will contact you. I recommend repeating colonoscopy in 5-10 years for surveillance purposes depending on pathology results.   You also have diverticulosis and internal hemorrhoids. I would recommend increasing fiber in your diet or adding OTC Benefiber/Metamucil. Be sure to drink at least 4 to 6 glasses of water daily. Follow-up with GI as needed.   I hope you have a great rest of your week!  Elon Alas. Abbey Chatters, D.O. Gastroenterology and Hepatology [facility]

## 2022-04-14 NOTE — Op Note (Signed)
[facility] Patient Name: Nichole Gay Procedure Date: 04/14/2022 7:10 AM MRN: [id number] Date of Birth: 07/30/1955 Attending MD: Elon Alas. Abbey Chatters , [place], [id number] CSN: [id number] Age: 66 Admit Type: Outpatient Procedure:                Colonoscopy Indications:              Positive Cologuard test Providers:                Elon Alas. Abbey Chatters, DO, Lambert Mody, Everardo Pacific Referring MD:              Medicines:                See the Anesthesia note for documentation of the                            administered medications Complications:            No immediate complications. Estimated Blood Loss:     Estimated blood loss was minimal. Procedure:                Pre-Anesthesia Assessment:                           - The anesthesia plan was to use monitored                            anesthesia care (MAC).                           After obtaining informed consent, the colonoscope                            was passed under direct vision. Throughout the                            procedure, the patient's blood pressure, pulse, and                            oxygen saturations were monitored continuously. The                            [id number]-HQ190L ([id number]) scope was introduced through                            the anus and advanced to the the cecum, identified                            by appendiceal orifice and ileocecal valve. The                            colonoscopy was performed without difficulty. The                            patient tolerated the procedure well. The quality  of the bowel preparation was evaluated using the                            BBPS [facility] Bowel Preparation Scale) with scores                            of: Right Colon = 3, Transverse Colon = 3 and Left                            Colon = 3 (entire mucosa seen well with no residual                            staining, small fragments of  stool or opaque                            liquid). The total BBPS score equals 9. Scope In: 7:42:16 AM Scope Out: 8:01:15 AM Scope Withdrawal Time: 0 hours 14 minutes 16 seconds  Total Procedure Duration: 0 hours 18 minutes 59 seconds  Findings:      The perianal and digital rectal examinations were normal.      Non-bleeding internal hemorrhoids were found during endoscopy.      Many large-mouthed and small-mouthed diverticula were found in the       sigmoid colon, descending colon and transverse colon.      A 9 mm polyp was found in the transverse colon. The polyp was sessile.       The polyp was removed with a cold snare. Resection and retrieval were       complete.      Two sessile polyps were found in the sigmoid colon. The polyps were 4 to       6 mm in size. These polyps were removed with a cold snare. Resection and       retrieval were complete.      The exam was otherwise without abnormality. Impression:               - Non-bleeding internal hemorrhoids.                           - Diverticulosis in the sigmoid colon, in the                            descending colon and in the transverse colon.                           - One 9 mm polyp in the transverse colon, removed                            with a cold snare. Resected and retrieved.                           - Two 4 to 6 mm polyps in the sigmoid colon,                            removed with a cold snare. Resected and retrieved.                           -  The examination was otherwise normal. Moderate Sedation:      Per Anesthesia Care Recommendation:           - Patient has a contact number available for                            emergencies. The signs and symptoms of potential                            delayed complications were discussed with the                            patient. Return to normal activities tomorrow.                            Written discharge instructions were provided to the                             patient.                           - Resume previous diet.                           - Continue present medications.                           - Await pathology results.                           - Repeat colonoscopy in 5-10 years for surveillance.                           - Return to GI clinic PRN. Procedure Code(s):        --- Professional ---                           [phone], Colonoscopy, flexible; with removal of                            tumor(s), polyp(s), or other lesion(s) by snare                            technique Diagnosis Code(s):        --- Professional ---                           K64.8, Other hemorrhoids                           D12.3, Benign neoplasm of transverse colon (hepatic                            flexure or splenic flexure)                           D12.5, Benign neoplasm of sigmoid colon  R19.5, Other fecal abnormalities                           K57.30, Diverticulosis of large intestine without                            perforation or abscess without bleeding CPT copyright 2022 American Medical Association. All rights reserved. The codes documented in this report are preliminary and upon coder review may  be revised to meet current compliance requirements. Elon Alas. Abbey Chatters, DO Pierce Abbey Chatters, DO 04/14/2022 8:12:31 AM This report has been signed electronically. Number of Addenda: 0

## 2022-04-14 NOTE — Interval H&P Note (Signed)
History and Physical Interval Note:  04/14/2022 7:36 AM  Nichole Gay  has presented today for surgery, with the diagnosis of positive cologuard.  The various methods of treatment have been discussed with the patient and family. After consideration of risks, benefits and other options for treatment, the patient has consented to  Procedure(s) with comments: COLONOSCOPY WITH PROPOFOL (N/A) - 10:00 am, LM to see if pt can move up as a surgical intervention.  The patient's history has been reviewed, patient examined, no change in status, stable for surgery.  I have reviewed the patient's chart and labs.  Questions were answered to the patient's satisfaction.     Eloise Harman

## 2022-04-14 NOTE — Transfer of Care (Signed)
Immediate Anesthesia Transfer of Care Note  Patient: Nichole Gay  Procedure(s) Performed: COLONOSCOPY WITH PROPOFOL POLYPECTOMY  Patient Location: Endoscopy Unit  Anesthesia Type:General  Level of Consciousness: awake, alert , and oriented  Airway & Oxygen Therapy: Patient Spontanous Breathing  Post-op Assessment: Report given to RN and Post -op Vital signs reviewed and stable  Post vital signs: Reviewed and stable  Last Vitals:  Vitals Value Taken Time  BP 110/71 04/14/22 0802  Temp 36.5 C 04/14/22 0802  Pulse 83 04/14/22 0802  Resp 16 04/14/22 0802  SpO2 99 % 04/14/22 0802    Last Pain:  Vitals:   04/14/22 0802  TempSrc: Axillary  PainSc: 0-No pain      Patients Stated Pain Goal: 9 (37/54/36 0677)  Complications: No notable events documented.

## 2022-04-15 LAB — SURGICAL PATHOLOGY

## 2022-04-21 ENCOUNTER — Encounter (HOSPITAL_COMMUNITY): Payer: Self-pay | Admitting: Internal Medicine

## 2022-07-25 DIAGNOSIS — J309 Allergic rhinitis, unspecified: Secondary | ICD-10-CM | POA: Diagnosis not present

## 2022-07-25 DIAGNOSIS — J019 Acute sinusitis, unspecified: Secondary | ICD-10-CM | POA: Diagnosis not present

## 2022-07-31 DIAGNOSIS — H11041 Peripheral pterygium, stationary, right eye: Secondary | ICD-10-CM | POA: Diagnosis not present

## 2022-07-31 DIAGNOSIS — H2513 Age-related nuclear cataract, bilateral: Secondary | ICD-10-CM | POA: Diagnosis not present

## 2022-07-31 DIAGNOSIS — H43813 Vitreous degeneration, bilateral: Secondary | ICD-10-CM | POA: Diagnosis not present

## 2022-07-31 DIAGNOSIS — H43391 Other vitreous opacities, right eye: Secondary | ICD-10-CM | POA: Diagnosis not present

## 2023-01-19 ENCOUNTER — Other Ambulatory Visit (HOSPITAL_COMMUNITY): Payer: Self-pay | Admitting: Adult Health

## 2023-01-19 DIAGNOSIS — Z1231 Encounter for screening mammogram for malignant neoplasm of breast: Secondary | ICD-10-CM

## 2023-01-25 ENCOUNTER — Ambulatory Visit (HOSPITAL_COMMUNITY)
Admission: RE | Admit: 2023-01-25 | Discharge: 2023-01-25 | Disposition: A | Discharge status: Home / Self Care | Payer: Medicare Other | Source: Ambulatory Visit | Attending: Adult Health | Admitting: Adult Health

## 2023-01-25 DIAGNOSIS — Z1231 Encounter for screening mammogram for malignant neoplasm of breast: Secondary | ICD-10-CM | POA: Diagnosis not present

## 2023-03-04 ENCOUNTER — Encounter: Payer: Self-pay | Admitting: Adult Health

## 2023-03-04 ENCOUNTER — Ambulatory Visit: Payer: Medicare Other | Admitting: Adult Health

## 2023-03-04 VITALS — BP 141/98 | HR 89 | Ht 63.0 in | Wt 162.0 lb

## 2023-03-04 DIAGNOSIS — Z01419 Encounter for gynecological examination (general) (routine) without abnormal findings: Secondary | ICD-10-CM

## 2023-03-04 DIAGNOSIS — R030 Elevated blood-pressure reading, without diagnosis of hypertension: Secondary | ICD-10-CM

## 2023-03-04 DIAGNOSIS — E7800 Pure hypercholesterolemia, unspecified: Secondary | ICD-10-CM | POA: Diagnosis not present

## 2023-03-04 DIAGNOSIS — Z1211 Encounter for screening for malignant neoplasm of colon: Secondary | ICD-10-CM

## 2023-03-04 DIAGNOSIS — Z780 Asymptomatic menopausal state: Secondary | ICD-10-CM | POA: Diagnosis not present

## 2023-03-04 DIAGNOSIS — F17200 Nicotine dependence, unspecified, uncomplicated: Secondary | ICD-10-CM | POA: Diagnosis not present

## 2023-03-04 DIAGNOSIS — R7309 Other abnormal glucose: Secondary | ICD-10-CM

## 2023-03-04 LAB — HEMOCCULT GUIAC POC 1CARD (OFFICE): Fecal Occult Blood, POC: NEGATIVE

## 2023-03-04 NOTE — Progress Notes (Signed)
Patient ID: Nichole Gay, female   DOB: [birth year], 67 y.o.   MRN: [id number] History of Present Illness: Nichole Gay is a 67 year old white female, married, PM in for a well woman gyn exam.      Component Value Date/Time   DIAGPAP  02/07/2021 1131    - Negative for intraepithelial lesion or malignancy (NILM)   DIAGPAP  01/28/2018 0000    NEGATIVE FOR INTRAEPITHELIAL LESIONS OR MALIGNANCY.   HPVHIGH Negative 02/07/2021 1131   ADEQPAP  02/07/2021 1131    Satisfactory for evaluation; transformation zone component PRESENT.   ADEQPAP  01/28/2018 0000    Satisfactory for evaluation  endocervical/transformation zone component PRESENT.    PCP is Lianne Moris PA.   Current Medications, Allergies, Past Medical History, Past Surgical History, Family History and Social History were reviewed in [employer] record.     Review of Systems: Patient denies any headaches, hearing loss, fatigue, blurred vision, shortness of breath, chest pain, abdominal pain, problems with bowel movements, urination, or intercourse. No joint pain or mood swings.  Denies any vaginal bleeding   Physical Exam:BP (!) 141/98 (BP Location: Right Arm, Patient Position: Sitting, Cuff Size: Normal)   Pulse 89   Ht 5\' 3"  (1.6 m)   Wt 162 lb (73.5 kg)   BMI 28.70 kg/m   General:  Well developed, well nourished, no acute distress Skin:  Warm and dry Neck:  Midline trachea, normal thyroid, good ROM, no lymphadenopathy,no carotid bruits heard Lungs; Clear to auscultation bilaterally Breast:  No dominant palpable mass, retraction, or nipple discharge Cardiovascular: Regular rate and rhythm Abdomen:  Soft, non tender, no hepatosplenomegaly Pelvic:  External genitalia is normal in appearance, no lesions.  The vagina is pale with loss of moisture and rugae. Urethra has no lesions or masses. The cervix is smooth  Uterus is felt to be normal size, shape, and contour.  No adnexal masses or tenderness  noted.Bladder is non tender, no masses felt. Rectal: Good sphincter tone, no polyps, or hemorrhoids felt.  Hemoccult negative. Extremities/musculoskeletal:  No swelling or varicosities noted, no clubbing or cyanosis Psych:  No mood changes, alert and cooperative,seems happy AA is 0 Fall risk is low    03/04/2023    8:31 AM 02/10/2022    8:40 AM 02/07/2021    8:47 AM  Depression screen PHQ 2/9  Decreased Interest 0 0 0  Down, Depressed, Hopeless 0 0 0  PHQ - 2 Score 0 0 0  Altered sleeping 0 0 1  Tired, decreased energy 1 0 1  Change in appetite 0 0 0  Feeling bad or failure about yourself  0 0 0  Trouble concentrating 0 0 0  Moving slowly or fidgety/restless 0 0 0  Suicidal thoughts 0 0 0  PHQ-9 Score 1 0 2       03/04/2023    8:32 AM 02/10/2022    8:40 AM 02/07/2021    8:48 AM 01/26/2020   10:06 AM  GAD 7 : Generalized Anxiety Score  Nervous, Anxious, on Edge 0 0 0 1  Control/stop worrying 0 0 0 0  Worry too much - different things 0 0 1 1  Trouble relaxing 0 0 0 0  Restless 0 0 0 1  Easily annoyed or irritable 0 0 1 0  Afraid - awful might happen 0 0 0 0  Total GAD 7 Score 0 0 2 3  Anxiety Difficulty    Not difficult at all  Upstream - 03/04/23 0835       Pregnancy Intention Screening   Does the patient want to become pregnant in the next year? N/A    Does the patient's partner want to become pregnant in the next year? N/A    Would the patient like to discuss contraceptive options today? N/A      Contraception Wrap Up   Current Method Female Sterilization   PM   End Method Female Sterilization   PM   Contraception Counseling Provided No    How was the end contraceptive method provided? N/A             Examination chaperoned by Malachy Mood LPN  Impression and Plan: 1. Encounter for well woman exam with routine gynecological exam Physical in 1 year with pap Will check labs Mammogram was negative 01/25/23 Colonoscopy per GI, had last year after +  cologuard  - CBC - Comprehensive metabolic panel - Lipid panel  2. Encounter for screening fecal occult blood testing Hemoccult was negative  - POCT occult blood stool  3. Elevated cholesterol - Lipid panel  4. Elevated hemoglobin A1c - Hemoglobin A1c  5. Smoker She says she does not smoke that much  Discussed getting low dose CT of lungs, can talk with PCP  6. Postmenopause Denies any vaginal bleeding  Time for DEXA, but can talk with PCP first   7. Elevated BP without diagnosis of hypertension Keep check on BP Follow up with PCP

## 2023-03-05 LAB — COMPREHENSIVE METABOLIC PANEL
ALT: 15 [IU]/L (ref 0–32)
AST: 18 [IU]/L (ref 0–40)
Albumin: 4.2 g/dL (ref 3.9–4.9)
Alkaline Phosphatase: 82 [IU]/L (ref 44–121)
BUN/Creatinine Ratio: 16 (ref 12–28)
BUN: 14 mg/dL (ref 8–27)
Bilirubin Total: 0.5 mg/dL (ref 0.0–1.2)
CO2: 26 mmol/L (ref 20–29)
Calcium: 10.2 mg/dL (ref 8.7–10.3)
Chloride: 102 mmol/L (ref 96–106)
Creatinine, Ser: 0.9 mg/dL (ref 0.57–1.00)
Globulin, Total: 2.4 g/dL (ref 1.5–4.5)
Glucose: 98 mg/dL (ref 70–99)
Potassium: 4.5 mmol/L (ref 3.5–5.2)
Sodium: 140 mmol/L (ref 134–144)
Total Protein: 6.6 g/dL (ref 6.0–8.5)
eGFR: 71 mL/min/{1.73_m2} (ref >59)

## 2023-03-05 LAB — LIPID PANEL
Chol/HDL Ratio: 3.7 ratio (ref 0.0–4.4)
Cholesterol, Total: 218 mg/dL — ABNORMAL HIGH (ref 100–199)
HDL: 59 mg/dL (ref >39)
LDL Chol Calc (NIH): 132 mg/dL — ABNORMAL HIGH (ref 0–99)
Triglycerides: 153 mg/dL — ABNORMAL HIGH (ref 0–149)
VLDL Cholesterol Cal: 27 mg/dL (ref 5–40)

## 2023-03-05 LAB — CBC
Hematocrit: 44.1 % (ref 34.0–46.6)
Hemoglobin: 14.5 g/dL (ref 11.1–15.9)
MCH: 30.9 pg (ref 26.6–33.0)
MCHC: 32.9 g/dL (ref 31.5–35.7)
MCV: 94 fL (ref 79–97)
Platelets: 364 10*3/uL (ref 150–450)
RBC: 4.69 x10E6/uL (ref 3.77–5.28)
RDW: 13.3 % (ref 11.7–15.4)
WBC: 6.9 10*3/uL (ref 3.4–10.8)

## 2023-03-05 LAB — HEMOGLOBIN A1C
Est. average glucose Bld gHb Est-mCnc: 120 mg/dL
Hgb A1c MFr Bld: 5.8 % — ABNORMAL HIGH (ref 4.8–5.6)

## 2023-03-18 DIAGNOSIS — H40033 Anatomical narrow angle, bilateral: Secondary | ICD-10-CM | POA: Diagnosis not present

## 2023-03-18 DIAGNOSIS — H2513 Age-related nuclear cataract, bilateral: Secondary | ICD-10-CM | POA: Diagnosis not present

## 2023-03-25 DIAGNOSIS — M79674 Pain in right toe(s): Secondary | ICD-10-CM | POA: Diagnosis not present

## 2023-03-25 DIAGNOSIS — L03031 Cellulitis of right toe: Secondary | ICD-10-CM | POA: Diagnosis not present

## 2023-03-25 DIAGNOSIS — M79671 Pain in right foot: Secondary | ICD-10-CM | POA: Diagnosis not present

## 2023-04-15 DIAGNOSIS — L03031 Cellulitis of right toe: Secondary | ICD-10-CM | POA: Diagnosis not present

## 2023-04-15 DIAGNOSIS — M79671 Pain in right foot: Secondary | ICD-10-CM | POA: Diagnosis not present

## 2023-04-15 DIAGNOSIS — L600 Ingrowing nail: Secondary | ICD-10-CM | POA: Diagnosis not present

## 2023-04-15 DIAGNOSIS — M79674 Pain in right toe(s): Secondary | ICD-10-CM | POA: Diagnosis not present

## 2023-04-25 DIAGNOSIS — J069 Acute upper respiratory infection, unspecified: Secondary | ICD-10-CM | POA: Diagnosis not present

## 2023-05-06 DIAGNOSIS — J209 Acute bronchitis, unspecified: Secondary | ICD-10-CM | POA: Diagnosis not present

## 2023-10-14 DIAGNOSIS — J0101 Acute recurrent maxillary sinusitis: Secondary | ICD-10-CM | POA: Diagnosis not present

## 2023-12-08 DIAGNOSIS — J019 Acute sinusitis, unspecified: Secondary | ICD-10-CM | POA: Diagnosis not present

## 2023-12-08 DIAGNOSIS — Z6827 Body mass index (BMI) 27.0-27.9, adult: Secondary | ICD-10-CM | POA: Diagnosis not present

## 2023-12-08 DIAGNOSIS — J209 Acute bronchitis, unspecified: Secondary | ICD-10-CM | POA: Diagnosis not present

## 2023-12-24 ENCOUNTER — Other Ambulatory Visit (HOSPITAL_COMMUNITY): Payer: Self-pay | Admitting: Family Medicine

## 2023-12-24 DIAGNOSIS — Z1231 Encounter for screening mammogram for malignant neoplasm of breast: Secondary | ICD-10-CM

## 2024-01-28 ENCOUNTER — Ambulatory Visit (HOSPITAL_COMMUNITY)
Admission: RE | Admit: 2024-01-28 | Discharge: 2024-01-28 | Disposition: A | Discharge status: Home / Self Care | Source: Ambulatory Visit | Attending: Family Medicine | Admitting: Family Medicine

## 2024-01-28 ENCOUNTER — Encounter (HOSPITAL_COMMUNITY): Payer: Self-pay

## 2024-01-28 DIAGNOSIS — Z1231 Encounter for screening mammogram for malignant neoplasm of breast: Secondary | ICD-10-CM | POA: Diagnosis not present

## 2024-02-22 DIAGNOSIS — J0100 Acute maxillary sinusitis, unspecified: Secondary | ICD-10-CM | POA: Diagnosis not present

## 2024-02-29 DIAGNOSIS — Z13220 Encounter for screening for lipoid disorders: Secondary | ICD-10-CM | POA: Diagnosis not present

## 2024-02-29 DIAGNOSIS — Z1329 Encounter for screening for other suspected endocrine disorder: Secondary | ICD-10-CM | POA: Diagnosis not present

## 2024-02-29 DIAGNOSIS — Z130 Encounter for screening for diseases of the blood and blood-forming organs and certain disorders involving the immune mechanism: Secondary | ICD-10-CM | POA: Diagnosis not present

## 2024-03-10 DIAGNOSIS — E782 Mixed hyperlipidemia: Secondary | ICD-10-CM | POA: Diagnosis not present

## 2024-03-10 DIAGNOSIS — Z0000 Encounter for general adult medical examination without abnormal findings: Secondary | ICD-10-CM | POA: Diagnosis not present

## 2024-03-10 DIAGNOSIS — Z23 Encounter for immunization: Secondary | ICD-10-CM | POA: Diagnosis not present

## 2024-03-10 DIAGNOSIS — Z720 Tobacco use: Secondary | ICD-10-CM | POA: Diagnosis not present

## 2024-03-10 DIAGNOSIS — N3281 Overactive bladder: Secondary | ICD-10-CM | POA: Diagnosis not present

## 2024-03-10 DIAGNOSIS — Z6828 Body mass index (BMI) 28.0-28.9, adult: Secondary | ICD-10-CM | POA: Diagnosis not present
